# Patient Record
Sex: Female | Born: 1995 | Race: Black or African American | Hispanic: No | Marital: Single | Smoking: Never smoker
Health system: Southern US, Community
[De-identification: ages and names within clinical notes are randomized; demographics above are authoritative.]

## PROBLEM LIST (undated history)

## (undated) DIAGNOSIS — D649 Anemia, unspecified: Secondary | ICD-10-CM

## (undated) DIAGNOSIS — K219 Gastro-esophageal reflux disease without esophagitis: Secondary | ICD-10-CM

## (undated) HISTORY — DX: Gastro-esophageal reflux disease without esophagitis: K21.9

## (undated) HISTORY — PX: TONSILLECTOMY: SUR1361

## (undated) HISTORY — DX: Anemia, unspecified: D64.9

---
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
## 2018-06-12 LAB — CYTOLOGY - PAP

## 2019-05-07 LAB — OB RESULTS CONSOLE ANTIBODY SCREEN: Antibody Screen: NEGATIVE

## 2019-05-07 LAB — OB RESULTS CONSOLE RUBELLA ANTIBODY, IGM: Rubella: IMMUNE

## 2019-05-07 LAB — OB RESULTS CONSOLE HGB/HCT, BLOOD
HCT: 36 (ref 29–41)
Hemoglobin: 12

## 2019-05-07 LAB — OB RESULTS CONSOLE HIV ANTIBODY (ROUTINE TESTING): HIV: NONREACTIVE

## 2019-05-07 LAB — OB RESULTS CONSOLE HEPATITIS B SURFACE ANTIGEN: Hepatitis B Surface Ag: NEGATIVE

## 2019-05-07 LAB — OB RESULTS CONSOLE ABO/RH: RH Type: POSITIVE

## 2019-06-23 LAB — OB RESULTS CONSOLE GC/CHLAMYDIA
Chlamydia: NEGATIVE
Gonorrhea: NEGATIVE

## 2019-10-01 ENCOUNTER — Telehealth: Payer: Self-pay | Admitting: Family Medicine

## 2019-10-01 NOTE — Telephone Encounter (Signed)
Attempted to call patient to get her scheduled to start prenatal care now that we have her records. No answer, number rang several times than stated that the call could not be completed at this time.

## 2019-10-02 ENCOUNTER — Telehealth: Payer: Self-pay | Admitting: Family Medicine

## 2019-10-02 NOTE — Telephone Encounter (Signed)
Opened in error

## 2019-10-05 ENCOUNTER — Ambulatory Visit (INDEPENDENT_AMBULATORY_CARE_PROVIDER_SITE_OTHER): Payer: Managed Care, Other (non HMO) | Admitting: *Deleted

## 2019-10-05 ENCOUNTER — Other Ambulatory Visit: Payer: Self-pay

## 2019-10-05 DIAGNOSIS — K219 Gastro-esophageal reflux disease without esophagitis: Secondary | ICD-10-CM

## 2019-10-05 DIAGNOSIS — Z3490 Encounter for supervision of normal pregnancy, unspecified, unspecified trimester: Secondary | ICD-10-CM

## 2019-10-05 DIAGNOSIS — O350XX0 Maternal care for (suspected) central nervous system malformation in fetus, not applicable or unspecified: Secondary | ICD-10-CM

## 2019-10-05 DIAGNOSIS — O3503X0 Maternal care for (suspected) central nervous system malformation or damage in fetus, choroid plexus cysts, not applicable or unspecified: Secondary | ICD-10-CM

## 2019-10-05 HISTORY — DX: Maternal care for (suspected) central nervous system malformation in fetus, not applicable or unspecified: O35.0XX0

## 2019-10-05 HISTORY — DX: Maternal care for (suspected) central nervous system malformation or damage in fetus, choroid plexus cysts, not applicable or unspecified: O35.03X0

## 2019-10-05 NOTE — Patient Instructions (Signed)

## 2019-10-05 NOTE — Progress Notes (Signed)
I connected with  Heather Rush on 10/05/19 at  8:30 AM EST by telephone and verified that I am speaking with the correct person using two identifiers.   I discussed the limitations, risks, security and privacy concerns of performing an evaluation and management service by telephone and the availability of in person appointments. I also discussed with the patient that there may be a patient responsible charge related to this service. The patient expressed understanding and agreed to proceed. Explained I am completing her New OB Intake today. We discussed she is a transfer of care from [place]. We discussed Her EDD and that it is based on  sure LMP . I reviewed her allergies, meds, OB History, Medical /Surgical history, and appropriate screenings. I explained I will send her the [employer] app- app sent to her while on phone.  I asked if she can purchase a blood pressure cuff  To check her blood pressure weekly as it is not covered by her insurance. I asked her to bring the blood pressure cuff with her to her first ob appointment so we can show her how to use it. Explained  then we will have her take her blood pressure weekly and enter into the app. Explained she will have some visits in office and some virtually. She already has Community education officer. Reviewed appointment date/ time with her , our location and to wear mask, no visitors. Explained she will have exam, ob bloodwork as indicated including 2 hr gtt which we will schedule for an am appointment. I offered her genetic testing as not done previously; she declines. I reviewed her previous US with Dr.Ervin and ordered a follow up US to fu choroid plexus. I  gave her the appointment. I informed her of our [facility] services and that she can schedule appointment if she feels she needs at any time in her pregnancy.  She voices understanding.  Jesly Hartmann,RN 10/05/2019  8:35 AM

## 2019-10-08 ENCOUNTER — Encounter: Payer: Self-pay | Admitting: *Deleted

## 2019-10-08 ENCOUNTER — Ambulatory Visit (INDEPENDENT_AMBULATORY_CARE_PROVIDER_SITE_OTHER): Payer: Managed Care, Other (non HMO) | Admitting: Obstetrics and Gynecology

## 2019-10-08 ENCOUNTER — Other Ambulatory Visit: Payer: Self-pay

## 2019-10-08 VITALS — BP 106/65 | HR 87 | Wt 151.0 lb

## 2019-10-08 DIAGNOSIS — B9689 Other specified bacterial agents as the cause of diseases classified elsewhere: Secondary | ICD-10-CM

## 2019-10-08 DIAGNOSIS — Z23 Encounter for immunization: Secondary | ICD-10-CM | POA: Diagnosis not present

## 2019-10-08 DIAGNOSIS — O26893 Other specified pregnancy related conditions, third trimester: Secondary | ICD-10-CM

## 2019-10-08 DIAGNOSIS — Z8619 Personal history of other infectious and parasitic diseases: Secondary | ICD-10-CM

## 2019-10-08 DIAGNOSIS — Z3A31 31 weeks gestation of pregnancy: Secondary | ICD-10-CM

## 2019-10-08 DIAGNOSIS — B373 Candidiasis of vulva and vagina: Secondary | ICD-10-CM | POA: Diagnosis not present

## 2019-10-08 DIAGNOSIS — Z113 Encounter for screening for infections with a predominantly sexual mode of transmission: Secondary | ICD-10-CM | POA: Diagnosis not present

## 2019-10-08 DIAGNOSIS — N898 Other specified noninflammatory disorders of vagina: Secondary | ICD-10-CM | POA: Diagnosis not present

## 2019-10-08 DIAGNOSIS — N760 Acute vaginitis: Secondary | ICD-10-CM

## 2019-10-08 DIAGNOSIS — O99013 Anemia complicating pregnancy, third trimester: Secondary | ICD-10-CM

## 2019-10-08 DIAGNOSIS — Z3490 Encounter for supervision of normal pregnancy, unspecified, unspecified trimester: Secondary | ICD-10-CM

## 2019-10-08 DIAGNOSIS — O99019 Anemia complicating pregnancy, unspecified trimester: Secondary | ICD-10-CM

## 2019-10-08 NOTE — Progress Notes (Signed)
Patient reports large amounts of white milky discharge with odor

## 2019-10-08 NOTE — Patient Instructions (Signed)
AREA PEDIATRIC/FAMILY PRACTICE PHYSICIANS  [place] ([address]) . [facility] o Chambliss, MD; Eniola, MD; Hale, MD; Hensel, MD; McDiarmid, MD; McIntyer, MD; Neal, MD; Walden, MD o [address] [place], [place] [address] o [phone] o Mon-Fri 8:30-12:30, 1:30-5:00 o Providers come to see babies at [facility] o Accepting Medicaid . [facility] o Limited providers who accept newborns: Koirala, MD; Morrow, MD; Wolters, MD o [address], [place], [place] [address] o [phone] o Mon-Fri 8:00-5:30 o Babies seen by providers at [facility] o Does NOT accept Medicaid o Please call early in hospitalization for appointment (limited availability)  . [facility] o Mulberry, MD o [address] [place], [place] [address] o [phone] o Mon, Tue, Thur, Fri 8:30-5:00, Wed 10:00-7:00 (closed 1-2pm) o Babies seen by [facility] providers o Accepting Medicaid . [facility] o Rubin, MD o [address], [place], [place] [address] o [phone] o Mon-Fri 8:30-5:00, Sat 8:30-12:00 o Provider comes to see babies at [facility] o Accepting Medicaid o Must have been referred from current patients or contacted office prior to delivery . [facility] ([facility]) o Brown, MD; Chandler, MD; Ettefagh, MD; Grant, MD; Lester, MD; McCormick, MD; McQueen, MD; Prose, MD; Simha, MD; Stanley, MD; Stryffeler, NP; Tebben, NP o [address], [place], [place] [address] o [phone] o Mon, Tue, Thur, Fri 8:30-5:30, Wed 9:30-5:30, Sat 8:30-12:30 o Babies seen by [facility] providers o Accepting Medicaid o Only accepting infants of first-time parents or siblings of current patients o Hospital discharge coordinator will make follow-up appointment . Jack Amos o [address],  [place], [place]  [address] o [phone]   Fax - [phone] . [facility] o [address], [place], [place]  [address] o Phone - [phone]   Fax - [phone] . Shilpa Gosrani o [address], [place], [place]  [address] o [phone]  [place] ([address]) . [facility] o Bates, MD; [place], MD; Cooper, Cox, MD; MD; Davis, MD; Dovico, MD; Ettefaugh, MD; Little, MD; Lowe, MD; Keiffer, MD; Melvin, MD; Sumner, MD; Williams, MD o [address] [place] [address], [place], [place] [address] o [phone] o Mon-Fri 8:30-5:00 (extended evenings Mon-Thur as needed), Sat-Sun 10:00-1:00 o Providers come to see babies at [facility] o Accepting Medicaid for families of first-time babies and families with all children in the household age 3 and under. Must register with office prior to making appointment (M-F only). . [facility] o Henson, NP; Knapp, MD; Lalonde, MD; Tysinger, PA o [address] [place], [place] [address] o [phone] o Mon-Fri 8:00-5:00 o Babies seen by providers at [facility] o Does NOT accept Medicaid/Commercial Insurance Only . [facility] ([facility]  o Artis, MD; Barnes, MD; Bratton, MD; Coccaro, MD; [place] Gardner, MD; Kramer, MD; Marshall, MD; Netherton, MD; Poleto, MD; Skinner, MD o [address] [place], [place] [address] o [phone] o Mon-Fri 8:30-5:30, Sat (Oct.-Mar.) 9:00-1:00 o Babies seen by providers at [facility] o Accepting Medicaid  [place] ([address]) . [facility] o Reid, MD; Warner, MD o [address], [place],  [address] o [phone] o Mon-Fri 8:30-5:00, Sat 8:30-12:00 o Providers come to see babies at [facility] o Does NOT accept Medicaid . [facility] o Becker, PA; Hagler, MD; Scifres, PA; Sun, MD; Swayne, MD o [address],  [place], [place] [address] o [phone] o Mon-Fri 8:00-5:00 o Babies seen by providers at [facility] o Does NOT accept Medicaid o Only accepting babies of parents who are patients o Please call early in hospitalization for appointment (limited availability) . [facility] o Clark, MD; Frye, MD; Kelleher, MD; Mack, NP; Miller, MD; O'Keller, MD; Patterson, NP; Pudlo, MD; Puzio, MD; Thomas, MD; Tucker, MD; Twiselton, MD o [address], [place], [place] [address] o [phone] o Mon-Fri 8:00-5:00, Sat 9:00-12:00 o Providers come to see babies at [facility] o Does NOT accept Medicaid  [place] ([address]) . [facility] o Limited providers accepting new patients: Brake, NP; Wharton, PA o [address], [place], [place] [address] o [phone] o Mon-Fri 8:00-5:00 o Babies seen by providers at [facility] o Does NOT accept Medicaid o Only accepting babies of parents who are patients o Please call early in hospitalization for appointment (limited availability) . [facility] o Gay, MD; Quinlan, MD o [address] [place], [place] [address] o [phone] (press [phone] to schedule appointment) o Mon-Fri 8:00-5:00 o Providers come to see babies at [facility] o Does NOT accept Medicaid . [facility] o Mazer, MD o [address] [place], [place] [address] o [phone] o Mon-Fri 8:30-5:00 (lunch 12:30-1:00), extended hours by appointment only Wed 5:00-6:30 o Babies seen by [facility] providers o Accepting Medicaid . [facility] o Banks, MD; Jordan, MD; Koberlein, MD o [address], [place], [place] [address] o [phone] o Mon-Fri 8:00-5:00 o Babies seen by [facility] providers o Does NOT accept Medicaid . [facility] o Parker, MD; Hunter, MD; Wallace, DO o [address] [place], [place]  [address] o [phone] o Mon-Fri 8:00-5:00 o Babies seen by [facility] providers o Does NOT accept Medicaid . [facility] o Brandon, PA; Brecken, PA; Christy, NP; Dees, MD; DeClaire, MD; DeWeese, MD; Hansen, NP; Mills, NP; Warr, NP; Smoot, NP; Summer, MD; Vapne, MD o [address] [place], [place] [address] o [phone] o Mon-Fri 8:30-5:00, Sat 10:00-1:00 o Providers come to see babies at [facility] o Does NOT accept Medicaid o Free prenatal information session Tuesdays at 4:45pm . [facility] o Bouska, MD; Gordon, PA; Jeffery, PA; Weber, PA o [address] [place] [address] o [phone] o Mon-Fri 7:30-5:30 o Babies seen by [facility] providers . [facility] o [address], [place], [place]  [address] o [phone]   Fax - [phone]  [place] ([address]) . [facility] o [place]ese, MD o [address] [place], [place] [address] o [phone] o Mon-Thur 8:00-6:00 o Providers come to see babies at [facility] o Accepting Medicaid . [facility] o Anderson, NP; Badger, MD; Beal, PA; Spencer, PA o [address] [place], [place] [address] o [phone] o Mon-Thur 7:30-7:30, Fri 7:30-4:30 o Babies seen by [facility] providers o Accepting Medicaid . [facility] o Agbuya, MD; Klett, NP; Romgoolam, MD o [address], [place], [place] [address] o [phone] o Mon-Fri 8:30-5:00, Sat 8:30-12:00 o Providers come to see babies at [facility] o Accepting Medicaid o Must have "Meet & Greet" appointment at office prior to delivery . [facility]  [facility] ) o McCord,   MD; Wallace, MD; Wood, MD o [address], [place], [place] [address] o [phone] o Mon-Wed 8:00-6:00, Thur-Fri 8:00-5:00, Sat 9:00-12:00 o Providers come to  see babies at [facility] o Does NOT accept Medicaid o Only accepting siblings of current patients . [facility]  o [address], [place], [place]  [address] o [phone]   Fax - [phone] . [facility] o [address], [place], [place]  [address] o [phone]   Fax - [phone]  [place] ([address] & [address]) . [facility] o Cirigliano, DO; Matthews, DO o [address] [place], [place] [address] o [phone] o Mon-Fri 7:00-5:00 o Babies seen by [facility] providers o Does NOT accept Medicaid . [facility] o Briscoe, MD; Howley, PA; Moreira, PA o [address], [place], [place] [address] o [phone] o Mon-Fri 8:00-5:00 o Babies seen by [facility] providers o Accepting Medicaid . [facility] o Boyd, MD; Church, PA; Jones, NP; Osborn, PA o [address], , [place] [address] o [phone] o Mon-Fri 8:00-5:00 o Babies seen by providers at [facility] o Accepting Medicaid  [place] ([address]) . [facility] [place] o Wendling, DO o [address] [place], [place] [address] o [phone] o Mon-Fri 8:00-5:00 o Babies seen by [facility] providers o Does NOT accept Medicaid o Limited availability, please call early in hospitalization to schedule follow-up . [facility] o Calderon, PA; Cummings, MD; [place]illard, MD; Martin, PA; Olson, MD; VanDeven, PA o [address], [place], [place] [address] o [phone] o Mon-Fri 8:30-5:00, Sat 9:00-12:00 o Babies seen by providers at [facility] o Accepting Medicaid o Please register online then schedule online or call office o [email or web address] . [facility] o Hunter, NP; Kumar, MD; Martin Rogers, PA o [address], [place], [place] [address] o [phone] o Mon-Fri 8:00-5:00 o Babies seen by providers at [facility] o Accepting Medicaid . [facility] o Pal[place], MD; Kristi Fleenor, NP; West, MD o [address], [place], [place] [address] o [phone] o Mon-Fri 8:00-5:30, Sat&Sun by appointment (phones open at 8:30) o Babies seen by [facility] providers o Accepting Medicaid o Must be a first-time baby or sibling of current patient . [facility] [place]  o [address], [place], [place]  [address] o [phone]   Fax - [phone]  [place] ([address]) . [facility] o Brown, PA; Cowen, PA; Rice, MD; Helton, PA; Spry, MD o [address] [place], [place] [address] o [phone] o Mon-Thur 8:00-7:00, Fri 8:00-5:00, Sat 8:00-12:00, Sun 9:00-12:00 o Babies seen by [facility] providers o Accepting Medicaid . [facility] o Coe-Goins, MD; Marshall, MD; Pierre-Louis, MD o [address], [place], [place] [address] o [phone] o Mon-Thur 8:00-5:00 o Babies seen by providers at [facility] o Accepting Medicaid . [facility] o Bratton, MD; Coe-Goins, MD; Hayes, MD; Lewis, MD; List, MD; Lott, MD; Marshall, MD; Moran, MD; O'Neal, MD; Pierre-Louis, MD; Pitonzo, MD; Scholer, MD; Spangle, MD o [address] [place], [place]   [address] o [phone] o Mon-Fri 8:00-5:30, Sat (Oct.-Mar.) 9:00-1:00 o Babies seen by providers at [facility] o Accepting Medicaid o Must fill out new patient packet, available online at [email or web address] . [facility] ([facility] o Friddle, NP; Harris, NP; Kelly, NP; Logan, MD;  Melvin, PA; Poth, MD; Ramadoss, MD; Stanton, NP o [address], [place], [place] [address] o [phone] o Mon-Thur 8:00-5:30, Fri 8:00-5:00 o Babies seen by providers at [facility] o Accepting Medicaid  [place] ([address]) . [facility] o Dixon, PA; Bonny Doon, MD; Pickard, MD; Tapia, PA o [address], [place], [place] [address] o [phone] o Mon-Fri 8:00-5:00 o Babies seen by providers at [facility] o Accepting Medicaid   [place] ([address]) . [facility] [place] o Masneri, DO; Meyers, MD; Nelson, PA o [address], [place], [place] [address] o [phone] o Mon-Fri 8:00-5:00 o Babies seen by providers at [facility] o Does NOT accept Medicaid o Limited appointment availability, please call early in hospitalization  . [facility] [place] o Kunedd, DO; McGowen, MD o [address], [place], [place] [address] o [phone] o Mon-Fri 8:00-5:00 o Babies seen by [facility] providers o Does NOT accept Medicaid . [facility] [place] o Cameron, MD; MacDonald, MD; Michaels, PA; Nayak, MD o [address] [place] [address], [place], [place] [address] o [phone] o Mon-Fri 8:00-5:00 o After hours clinic ([address] [place], [place] [address]) [phone] Mon-Fri 5:00-8:00, Sat 12:00-6:00, Sun 10:00-4:00 o Babies seen by [facility] providers o Accepting Medicaid . [facility] [place] o [address], [place], [place]  [address] o [phone]   Fax - [phone]  [place] ([address]) . [facility] [place] [facility] o Andy, MD o [address], [place], [place] [address] o [phone] o Mon-Fri 8:00-5:00 o Babies seen by [facility] providers o Does NOT accept Medicaid . [facility] [place] ([facility] [place]) o Eksir, MD o [address], [place], [place]  [address] o [phone] o Mon-Thur 8:00-7:00, Fri 8:00-5:00, Sat 8:00-12:00 o Babies seen by providers at [facility] o Accepting Medicaid - but does not have vaccinations in office (must be received elsewhere) o Limited availability, please call early in hospitalization  [place] ([address]) . [facility]  o Charlene Flemming, MD o [address], [place] [address] o [phone]  Fax [phone]   

## 2019-10-08 NOTE — Progress Notes (Signed)
History:   Heather Rush is a 23 y.o. G3P2002 at [redacted]w[redacted]d by LMP being seen today for her first obstetrical visit.  Her obstetrical history is significant for transfer of care from [place], limited prenatal care. . Patient does intend to breast feed. Pregnancy history fully reviewed.  Patient reports Vaginal discharge .      HISTORY: OB History  Gravida Para Term Preterm AB Living  3 2 2  0 0 2  SAB TAB Ectopic Multiple Live Births  0 0 0 0 2    # Outcome Date GA Lbr Len/2nd Weight Sex Delivery Anes PTL Lv  3 Current           2 Term 01/08/06 [redacted]w[redacted]d  7 lb 15 oz (3.6 kg) F Vag-Vacuum Other  LIV     Birth Comments: no complications  1 Term 09/1922 [redacted]w[redacted]d  6 lb 1 oz (2.75 kg) F Vag-Spont None  LIV     Birth Comments:  no complications    Last pap smear was done 2019 and was normal  Past Medical History:  Diagnosis Date  . Acid reflux   . Anemia   . Chlamydia 2015  . Gonorrhea 2015   Past Surgical History:  Procedure Laterality Date  . TONSILLECTOMY     Family History  Problem Relation Age of Onset  . Bipolar disorder Mother    Social History   Tobacco Use  . Smoking status: Never Smoker  . Smokeless tobacco: Never Used  Substance Use Topics  . Alcohol use: Not Currently  . Drug use: Never   No Known Allergies Current Outpatient Medications on File Prior to Visit  Medication Sig Dispense Refill  . Prenatal Vit-Fe Fumarate-FA (PRENATAL VITAMINS PO) Take 1 tablet by mouth daily.     No current facility-administered medications on file prior to visit.     Review of Systems Pertinent items noted in HPI and remainder of comprehensive ROS otherwise negative. Physical Exam:   Vitals:   10/08/19 1502  BP: 106/65  Pulse: 87  Weight: 151 lb (68.5 kg)   Fetal Heart Rate (bpm): 125 Uterus:  Fundal Height: 30 cm  System: General: well-developed, well-nourished female in no acute distress   Skin: normal coloration and turgor, no rashes   Neurologic: oriented,  normal, negative, normal mood   Extremities: normal strength, tone, and muscle mass, ROM of all joints is normal   HEENT PERRLA, extraocular movement intact and sclera clear, anicteric   Cardiovascular: regular rate and rhythm   Respiratory:  no respiratory distress, normal breath sounds   Abdomen: soft, non-tender; bowel sounds normal; no masses,  no organomegaly      Assessment:    Pregnancy: 13/12/20 Patient Active Problem List   Diagnosis Date Noted  . History of chlamydia 10/08/2019  . History of gonorrhea 10/08/2019  . Supervision of low-risk pregnancy 10/05/2019  . Choroid plexus cyst of fetus affecting care of mother, antepartum 10/05/2019  . Acid reflux 10/05/2019     Plan:    1. Encounter for supervision of low-risk pregnancy, antepartum - Welcomed patient to practice - 2 hour GTT tomorrow - does not have a BP cuff however will get one. Virtual visit in 2 weeks.  - Tdap vaccine greater than or equal to 7yo IM  2. Vaginal discharge  - Cervicovaginal ancillary only( [place])   Initial labs drawn. Continue prenatal vitamins. Genetic Screening discussed, Declines : declined. Ultrasound discussed; fetal anatomic survey: ordered. Problem list reviewed and updated. The nature of  [place] with multiple MDs and other Advanced Practice Providers was explained to patient; also emphasized that residents, students are part of our team. Routine obstetric precautions reviewed. Return in about 2 weeks (around 10/22/2019) for virtual visit [email or web address]    Rasch, Artist Pais, [place] for [employer], [place]

## 2019-10-08 NOTE — Progress Notes (Deleted)
   PRENATAL VISIT NOTE  Subjective:  Heather Rush is a 23 y.o. G3P2002 at [redacted]w[redacted]d being seen today for ongoing prenatal care.  She is currently monitored for the following issues for this low-risk pregnancy and has Supervision of low-risk pregnancy; Choroid plexus cyst of fetus affecting care of mother, antepartum; and Acid reflux on their problem list.  Patient reports Vaginal discharge.  Contractions: Irritability. Vag. Bleeding: None.  Movement: Present. Denies leaking of fluid.   The following portions of the patient's history were reviewed and updated as appropriate: allergies, current medications, past family history, past medical history, past social history, past surgical history and problem list.   Objective:   Vitals:   10/08/19 1502  BP: 106/65  Pulse: 87  Weight: 151 lb (68.5 kg)    Fetal Status:     Movement: Present     General:  Alert, oriented and cooperative. Patient is in no acute distress.  Skin: Skin is warm and dry. No rash noted.   Cardiovascular: Normal heart rate noted  Respiratory: Normal respiratory effort, no problems with respiration noted  Abdomen: Soft, gravid, appropriate for gestational age.  Pain/Pressure: Absent     Pelvic: Cervical exam deferred        Extremities: Normal range of motion.  Edema: None  Mental Status: Normal mood and affect. Normal behavior. Normal judgment and thought content.   Assessment and Plan:  Pregnancy: G3P2002 at [redacted]w[redacted]d 1. Encounter for supervision of low-risk pregnancy, antepartum  - Tdap vaccine greater than or equal to 7yo IM  2. Vaginal discharge  - Cervicovaginal ancillary only( )  {Blank single:19197::"Term","Preterm"} labor symptoms and general obstetric precautions including but not limited to vaginal bleeding, contractions, leaking of fluid and fetal movement were reviewed in detail with the patient. Please refer to After Visit Summary for other counseling recommendations.   No follow-ups on  file.  Future Appointments  Date Time Provider [place]  10/09/2019  8:00 AM WOC-WOCA LAB WOC-WOCA WOC  10/16/2019  8:30 AM WH-MFC [place] 1 WH-MFCUS MFC-US    Noni Saupe, NP

## 2019-10-09 ENCOUNTER — Other Ambulatory Visit: Payer: Managed Care, Other (non HMO)

## 2019-10-09 DIAGNOSIS — Z3490 Encounter for supervision of normal pregnancy, unspecified, unspecified trimester: Secondary | ICD-10-CM

## 2019-10-09 LAB — CERVICOVAGINAL ANCILLARY ONLY
Bacterial Vaginitis (gardnerella): POSITIVE — AB
Candida Glabrata: NEGATIVE
Candida Vaginitis: POSITIVE — AB
Comment: NEGATIVE
Comment: NEGATIVE
Comment: NEGATIVE
Comment: NEGATIVE
Trichomonas: NEGATIVE

## 2019-10-10 LAB — CBC
Hematocrit: 30.2 % — ABNORMAL LOW (ref 34.0–46.6)
Hemoglobin: 10.1 g/dL — ABNORMAL LOW (ref 11.1–15.9)
MCH: 30.7 pg (ref 26.6–33.0)
MCHC: 33.4 g/dL (ref 31.5–35.7)
MCV: 92 fL (ref 79–97)
Platelets: 244 10*3/uL (ref 150–450)
RBC: 3.29 x10E6/uL — ABNORMAL LOW (ref 3.77–5.28)
RDW: 11.6 % — ABNORMAL LOW (ref 11.7–15.4)
WBC: 9.2 10*3/uL (ref 3.4–10.8)

## 2019-10-10 LAB — GLUCOSE TOLERANCE, 2 HOURS W/ 1HR
Glucose, 1 hour: 135 mg/dL (ref 65–179)
Glucose, 2 hour: 102 mg/dL (ref 65–152)
Glucose, Fasting: 80 mg/dL (ref 65–91)

## 2019-10-10 LAB — RPR: RPR Ser Ql: NONREACTIVE

## 2019-10-10 LAB — HIV ANTIBODY (ROUTINE TESTING W REFLEX): HIV Screen 4th Generation wRfx: NONREACTIVE

## 2019-10-12 ENCOUNTER — Other Ambulatory Visit: Payer: Self-pay | Admitting: Obstetrics and Gynecology

## 2019-10-12 DIAGNOSIS — O99019 Anemia complicating pregnancy, unspecified trimester: Secondary | ICD-10-CM | POA: Insufficient documentation

## 2019-10-12 MED ORDER — METRONIDAZOLE 500 MG PO TABS: 500.0000 mg | ORAL_TABLET | Freq: Two times a day (BID) | ORAL | 0 refills | Status: AC

## 2019-10-12 MED ORDER — TERCONAZOLE 0.4 % VA CREA: 1.0000 | TOPICAL_CREAM | Freq: Every day | VAGINAL | 0 refills | Status: DC

## 2019-10-14 ENCOUNTER — Other Ambulatory Visit: Payer: Self-pay | Admitting: Obstetrics and Gynecology

## 2019-10-14 MED ORDER — METRONIDAZOLE 0.75 % VA GEL: 1.0000 | Freq: Every day | VAGINAL | 0 refills | Status: DC

## 2019-10-16 ENCOUNTER — Other Ambulatory Visit: Payer: Self-pay

## 2019-10-16 ENCOUNTER — Other Ambulatory Visit: Payer: Self-pay | Admitting: Obstetrics and Gynecology

## 2019-10-16 ENCOUNTER — Ambulatory Visit (HOSPITAL_COMMUNITY)
Admission: RE | Admit: 2019-10-16 | Discharge: 2019-10-16 | Disposition: A | Discharge status: Home / Self Care | Payer: Managed Care, Other (non HMO) | Source: Ambulatory Visit | Attending: Obstetrics and Gynecology | Admitting: Obstetrics and Gynecology

## 2019-10-16 DIAGNOSIS — O359XX0 Maternal care for (suspected) fetal abnormality and damage, unspecified, not applicable or unspecified: Secondary | ICD-10-CM

## 2019-10-16 DIAGNOSIS — Z3490 Encounter for supervision of normal pregnancy, unspecified, unspecified trimester: Secondary | ICD-10-CM

## 2019-10-16 DIAGNOSIS — Z3A32 32 weeks gestation of pregnancy: Secondary | ICD-10-CM | POA: Diagnosis not present

## 2019-10-21 ENCOUNTER — Other Ambulatory Visit: Payer: Self-pay

## 2019-10-21 ENCOUNTER — Telehealth (INDEPENDENT_AMBULATORY_CARE_PROVIDER_SITE_OTHER): Payer: Managed Care, Other (non HMO) | Admitting: Advanced Practice Midwife

## 2019-10-21 VITALS — BP 121/79 | HR 83

## 2019-10-21 DIAGNOSIS — Z3480 Encounter for supervision of other normal pregnancy, unspecified trimester: Secondary | ICD-10-CM

## 2019-10-21 DIAGNOSIS — Z3A33 33 weeks gestation of pregnancy: Secondary | ICD-10-CM

## 2019-10-21 DIAGNOSIS — Z3483 Encounter for supervision of other normal pregnancy, third trimester: Secondary | ICD-10-CM

## 2019-10-21 NOTE — Progress Notes (Signed)
I connected with  Leilani Able on 10/21/19 at  9:15 AM EST by telephone and verified that I am speaking with the correct person using two identifiers.   I discussed the limitations, risks, security and privacy concerns of performing an evaluation and management service by telephone and the availability of in person appointments. I also discussed with the patient that there may be a patient responsible charge related to this service. The patient expressed understanding and agreed to proceed.  [place], [place] 10/21/2019  9:03 AM

## 2019-10-21 NOTE — Progress Notes (Signed)
   TELEHEALTH VIRTUAL OBSTETRICS VISIT ENCOUNTER NOTE  I connected with Heather Rush on 10/21/19 at  9:15 AM EST by telephone at home and verified that I am speaking with the correct person using two identifiers.   I discussed the limitations, risks, security and privacy concerns of performing an evaluation and management service by telephone and the availability of in person appointments. I also discussed with the patient that there may be a patient responsible charge related to this service. The patient expressed understanding and agreed to proceed.  Subjective:  Heather Rush is a 23 y.o. G3P2002 at [redacted]w[redacted]d being followed for ongoing prenatal care.  She is currently monitored for the following issues for this low-risk pregnancy and has Supervision of low-risk pregnancy; Choroid plexus cyst of fetus affecting care of mother, antepartum; Acid reflux; History of chlamydia; History of gonorrhea; and Anemia in pregnancy on their problem list.  Patient reports no complaints. Reports fetal movement. Denies any contractions, bleeding or leaking of fluid.   The following portions of the patient's history were reviewed and updated as appropriate: allergies, current medications, past family history, past medical history, past social history, past surgical history and problem list.   Objective:   General:  Alert, oriented and cooperative.   Mental Status: Normal mood and affect perceived. Normal judgment and thought content.  Rest of physical exam deferred due to type of encounter  Assessment and Plan:  Pregnancy: G3P2002 at [redacted]w[redacted]d 1. Supervision of other normal pregnancy, antepartum - routine care - Has BP cuff at home, advised to do weekly BP check and upload to babyscripts.   Preterm labor symptoms and general obstetric precautions including but not limited to vaginal bleeding, contractions, leaking of fluid and fetal movement were reviewed in detail with the patient.  I discussed the  assessment and treatment plan with the patient. The patient was provided an opportunity to ask questions and all were answered. The patient agreed with the plan and demonstrated an understanding of the instructions. The patient was advised to call back or seek an in-person office evaluation/go to MAU at [facility] for any urgent or concerning symptoms. Please refer to After Visit Summary for other counseling recommendations.   I provided 10 minutes of non-face-to-face time during this encounter.  Return in about 2 weeks (around 11/04/2019) for virtual visit, she is off on Fridays and would prefer a Friday appointment. .  No future appointments.  Marcille Buffy DNP, CNM  10/21/19  9:15 AM  [facility]

## 2019-11-06 ENCOUNTER — Telehealth (INDEPENDENT_AMBULATORY_CARE_PROVIDER_SITE_OTHER): Payer: Managed Care, Other (non HMO) | Admitting: Obstetrics and Gynecology

## 2019-11-06 ENCOUNTER — Other Ambulatory Visit: Payer: Self-pay

## 2019-11-06 ENCOUNTER — Encounter: Payer: Self-pay | Admitting: Obstetrics and Gynecology

## 2019-11-06 VITALS — BP 118/73 | HR 96

## 2019-11-06 DIAGNOSIS — Z3A35 35 weeks gestation of pregnancy: Secondary | ICD-10-CM

## 2019-11-06 DIAGNOSIS — O3503X0 Maternal care for (suspected) central nervous system malformation or damage in fetus, choroid plexus cysts, not applicable or unspecified: Secondary | ICD-10-CM

## 2019-11-06 DIAGNOSIS — O350XX0 Maternal care for (suspected) central nervous system malformation in fetus, not applicable or unspecified: Secondary | ICD-10-CM

## 2019-11-06 DIAGNOSIS — Z3493 Encounter for supervision of normal pregnancy, unspecified, third trimester: Secondary | ICD-10-CM

## 2019-11-06 NOTE — Progress Notes (Signed)
TELEHEALTH OBSTETRICS PRENATAL VIRTUAL VIDEO VISIT ENCOUNTER NOTE  Provider location: [facility] [place]   I connected with Braulio Conte on 11/06/19 at 11:15 AM EST by MyChart Video Encounter at home and verified that I am speaking with the correct person using two identifiers.   I discussed the limitations, risks, security and privacy concerns of performing an evaluation and management service virtually and the availability of in person appointments. I also discussed with the patient that there may be a patient responsible charge related to this service. The patient expressed understanding and agreed to proceed. Subjective:  Heather Rush is a 23 y.o. G3P2002 at [redacted]w[redacted]d being seen today for ongoing prenatal care.  She is currently monitored for the following issues for this low-risk pregnancy and has Supervision of low-risk pregnancy; Choroid plexus cyst of fetus affecting care of mother, antepartum; Acid reflux; History of chlamydia; History of gonorrhea; and Anemia in pregnancy on their problem list.  Patient reports general discomforts of pregnancy.  Contractions: Irritability. Vag. Bleeding: None.  Movement: Present. Denies any leaking of fluid.   The following portions of the patient's history were reviewed and updated as appropriate: allergies, current medications, past family history, past medical history, past social history, past surgical history and problem list.   Objective:   Vitals:   11/06/19 1123  BP: 118/73  Pulse: 96    Fetal Status:     Movement: Present     General:  Alert, oriented and cooperative. Patient is in no acute distress.  Respiratory: Normal respiratory effort, no problems with respiration noted  Mental Status: Normal mood and affect. Normal behavior. Normal judgment and thought content.  Rest of physical exam deferred due to type of encounter  Imaging: [place] MFM OB DETAIL +14 WK  Result Date:  10/16/2019 ----------------------------------------------------------------------  OBSTETRICS REPORT                       (Signed Final 10/16/2019 10:34 am) ---------------------------------------------------------------------- Patient Info  ID #:       [id number]                          D.O.B.:  Jun 23, 1996 (23 yrs)  Name:       Heather Rush                 Visit Date: 10/16/2019 08:35 am ---------------------------------------------------------------------- Performed By  Performed By:     Tomma Lightning             Ref. Address:     [address]                                                             [place], [place]  [id number]  Attending:        Ma RingsVictor Fang MD         [facility]  Referred By:      Hermina StaggersMICHAEL L Aizza Santiago                    MD ---------------------------------------------------------------------- Orders   #  Description                          Code         Ordered By   1  US MFM OB DETAIL +14 WK              [id number]     Nettie ElmMICHAEL Tikia Skilton  ----------------------------------------------------------------------   #  Order #                    Accession #                 Episode #   1  [id number]                  [id number]                  [id number]  ---------------------------------------------------------------------- Indications   Fetal abnormality - other known or             O35.9XX0   suspected ([facility] [place]; resolved, not seen on today's US)   Encounter for antenatal screening for          Z36.3   malformations   [redacted] weeks gestation of pregnancy                Z3A.32  ---------------------------------------------------------------------- Fetal Evaluation  Num Of Fetuses:         1  Fetal Heart Rate(bpm):  145  Cardiac Activity:        Observed  Presentation:           Cephalic  Placenta:               Posterior  P. Cord Insertion:      Visualized  Amniotic Fluid  AFI FV:      Within normal limits  AFI Sum(cm)     %Tile       Largest Pocket(cm)  11.96           32          4.89  RUQ(cm)       RLQ(cm)       LUQ(cm)        LLQ(cm)  4.89          3.04          3.4            0.63 ---------------------------------------------------------------------- Biometry  BPD:      86.1  mm     G. Age:  34w 5d         94  %    CI:  78.86   %    70 - 86                                                          FL/HC:      19.3   %    19.1 - 21.3  HC:      306.6  mm     G. Age:  34w 1d         60  %    HC/AC:      1.11        0.96 - 1.17  AC:      276.6  mm     G. Age:  31w 5d         29  %    FL/BPD:     68.6   %    71 - 87  FL:       59.1  mm     G. Age:  30w 6d          7  %    FL/AC:      21.4   %    20 - 24  HUM:      54.2  mm     G. Age:  31w 4d         36  %  LV:        2.7  mm  Est. FW:    1867  gm      4 lb 2 oz     25  % ---------------------------------------------------------------------- OB History  Gravidity:    3         Term:   2  Living:       2 ---------------------------------------------------------------------- Gestational Age  LMP:           32w 3d        Date:  03/03/19                 EDD:   12/08/19  U/S Today:     32w 6d                                        EDD:   12/05/19  Best:          Armida Sans 3d     Det. By:  LMP  (03/03/19)          EDD:   12/08/19 ---------------------------------------------------------------------- Anatomy  Cranium:               Appears normal         LVOT:                   Appears normal  Cavum:                 Not well visualized    Aortic Arch:            Appears normal  Ventricles:            Appears normal         Ductal Arch:            Not well visualized  Choroid Plexus:  Appears normal         Diaphragm:              Appears normal  Cerebellum:            Appears normal         Stomach:                 Appears normal, left                                                                        sided  Posterior Fossa:       Appears normal         Abdomen:                Appears normal  Nuchal Fold:           Not applicable (>20    Abdominal Wall:         Not well visualized                         wks GA)  Face:                  Appears normal         Cord Vessels:           Appears normal (3                         (orbits and profile)                           vessel cord)  Lips:                  Appears normal         Kidneys:                Appear normal  Palate:                Not well visualized    Bladder:                Appears normal  Thoracic:              Appears normal         Spine:                  Ltd views no                                                                        intracranial signs of  NTD  Heart:                 Appears normal         Upper Extremities:      Appears normal                         (4CH, axis, and                         situs)  RVOT:                  Not well visualized    Lower Extremities:      Appears normal  Other:  Heels visualized. Technically difficult due to advanced GA and fetal          position. ---------------------------------------------------------------------- Cervix Uterus Adnexa  Cervix  Not visualized (advanced GA >24wks)  Uterus  No abnormality visualized.  Left Ovary  Size(cm)       3.2  x   1.3    x  2.5       Vol(ml): 5.45  Within normal limits.  Right Ovary  Size(cm)       2.1  x   1.3    x  1.8       Vol(ml): 2.57  Within normal limits. ---------------------------------------------------------------------- Comments  This patient was seen for a detailed fetal anatomy scan as  she recently transferred her care for delivery at [facility]  from hospital in [place].  The patient reports 2  prior uncomplicated full-term deliveries. She denies any  significant  past medical history and denies any problems in  her current pregnancy.  Choroid plexus cysts were noted on  her fetal anatomy scan performed in [place].  She was informed that the fetal growth and amniotic fluid  level were appropriate for her gestational age.  The previously noted choroid plexus cysts were not visualized  today.  The views of the fetal anatomy were limited today due  to her advanced gestational age.  The patient was informed that anomalies may be missed due  to technical limitations. If the fetus is in a suboptimal position  or maternal habitus is increased, visualization of the fetus in  the maternal uterus may be impaired.  Follow up as indicated. ----------------------------------------------------------------------                   Ma Rings, MD Electronically Signed Final Report   10/16/2019 10:34 am ----------------------------------------------------------------------   Assessment and Plan:  Pregnancy: [id number] at [redacted]w[redacted]d 1. Encounter for supervision of low-risk pregnancy in third trimester Stable GBS next visit  2. Choroid plexus cyst of fetus affecting care of mother, antepartum, single or unspecified fetus Resolved on 09/2019 U/S  Preterm labor symptoms and general obstetric precautions including but not limited to vaginal bleeding, contractions, leaking of fluid and fetal movement were reviewed in detail with the patient. I discussed the assessment and treatment plan with the patient. The patient was provided an opportunity to ask questions and all were answered. The patient agreed with the plan and demonstrated an understanding of the instructions. The patient was advised to call back or seek an in-person office evaluation/go to MAU at [facility] for any urgent or concerning symptoms. Please refer to After Visit Summary for other counseling recommendations.   I provided 8 minutes of face-to-face time during this encounter.  Return in about 1 week  (around  11/13/2019) for OB visit, face to face any provider, prefers Friday's.  No future appointments.  Chancy Milroy, MD [facility] for [place], [place]

## 2019-11-06 NOTE — Patient Instructions (Signed)
Third Trimester of Pregnancy The third trimester is from week 28 through week 40 (months 7 through 9). The third trimester is a time when the unborn baby (fetus) is growing rapidly. At the end of the ninth month, the fetus is about 20 inches in length and weighs 6-10 pounds. Body changes during your third trimester Your body will continue to go through many changes during pregnancy. The changes vary from woman to woman. During the third trimester:  Your weight will continue to increase. You can expect to gain 25-35 pounds (11-16 kg) by the end of the pregnancy.  You may begin to get stretch marks on your hips, abdomen, and breasts.  You may urinate more often because the fetus is moving lower into your pelvis and pressing on your bladder.  You may develop or continue to have heartburn. This is caused by increased hormones that slow down muscles in the digestive tract.  You may develop or continue to have constipation because increased hormones slow digestion and cause the muscles that push waste through your intestines to relax.  You may develop hemorrhoids. These are swollen veins (varicose veins) in the rectum that can itch or be painful.  You may develop swollen, bulging veins (varicose veins) in your legs.  You may have increased body aches in the pelvis, back, or thighs. This is due to weight gain and increased hormones that are relaxing your joints.  You may have changes in your hair. These can include thickening of your hair, rapid growth, and changes in texture. Some women also have hair loss during or after pregnancy, or hair that feels dry or thin. Your hair will most likely return to normal after your baby is born.  Your breasts will continue to grow and they will continue to become tender. A yellow fluid (colostrum) may leak from your breasts. This is the first milk you are producing for your baby.  Your belly button may stick out.  You may notice more swelling in your hands,  face, or ankles.  You may have increased tingling or numbness in your hands, arms, and legs. The skin on your belly may also feel numb.  You may feel short of breath because of your expanding uterus.  You may have more problems sleeping. This can be caused by the size of your belly, increased need to urinate, and an increase in your body's metabolism.  You may notice the fetus "dropping," or moving lower in your abdomen (lightening).  You may have increased vaginal discharge.  You may notice your joints feel loose and you may have pain around your pelvic bone. What to expect at prenatal visits You will have prenatal exams every 2 weeks until week 36. Then you will have weekly prenatal exams. During a routine prenatal visit:  You will be weighed to make sure you and the baby are growing normally.  Your blood pressure will be taken.  Your abdomen will be measured to track your baby's growth.  The fetal heartbeat will be listened to.  Any test results from the previous visit will be discussed.  You may have a cervical check near your due date to see if your cervix has softened or thinned (effaced).  You will be tested for Group B streptococcus. This happens between 35 and 37 weeks. Your health care provider may ask you:  What your birth plan is.  How you are feeling.  If you are feeling the baby move.  If you have had any abnormal   symptoms, such as leaking fluid, bleeding, severe headaches, or abdominal cramping.  If you are using any tobacco products, including cigarettes, chewing tobacco, and electronic cigarettes.  If you have any questions. Other tests or screenings that may be performed during your third trimester include:  Blood tests that check for low iron levels (anemia).  Fetal testing to check the health, activity level, and growth of the fetus. Testing is done if you have certain medical conditions or if there are problems during the pregnancy.  Nonstress test  (NST). This test checks the health of your baby to make sure there are no signs of problems, such as the baby not getting enough oxygen. During this test, a belt is placed around your belly. The baby is made to move, and its heart rate is monitored during movement. What is false labor? False labor is a condition in which you feel small, irregular tightenings of the muscles in the womb (contractions) that usually go away with rest, changing position, or drinking water. These are called Braxton Hicks contractions. Contractions may last for hours, days, or even weeks before true labor sets in. If contractions come at regular intervals, become more frequent, increase in intensity, or become painful, you should see your health care provider. What are the signs of labor?  Abdominal cramps.  Regular contractions that start at 10 minutes apart and become stronger and more frequent with time.  Contractions that start on the top of the uterus and spread down to the lower abdomen and back.  Increased pelvic pressure and dull back pain.  A watery or bloody mucus discharge that comes from the vagina.  Leaking of amniotic fluid. This is also known as your "water breaking." It could be a slow trickle or a gush. Let your health care provider know if it has a color or strange odor. If you have any of these signs, call your health care provider right away, even if it is before your due date. Follow these instructions at home: Medicines  Follow your health care provider's instructions regarding medicine use. Specific medicines may be either safe or unsafe to take during pregnancy.  Take a prenatal vitamin that contains at least 600 micrograms (mcg) of folic acid.  If you develop constipation, try taking a stool softener if your health care provider approves. Eating and drinking   Eat a balanced diet that includes fresh fruits and vegetables, whole grains, good sources of protein such as meat, eggs, or tofu,  and low-fat dairy. Your health care provider will help you determine the amount of weight gain that is right for you.  Avoid raw meat and uncooked cheese. These carry germs that can cause birth defects in the baby.  If you have low calcium intake from food, talk to your health care provider about whether you should take a daily calcium supplement.  Eat four or five small meals rather than three large meals a day.  Limit foods that are high in fat and processed sugars, such as fried and sweet foods.  To prevent constipation: ? Drink enough fluid to keep your urine clear or pale yellow. ? Eat foods that are high in fiber, such as fresh fruits and vegetables, whole grains, and beans. Activity  Exercise only as directed by your health care provider. Most women can continue their usual exercise routine during pregnancy. Try to exercise for 30 minutes at least 5 days a week. Stop exercising if you experience uterine contractions.  Avoid heavy lifting.  Do   not exercise in extreme heat or humidity, or at high altitudes.  Wear low-heel, comfortable shoes.  Practice good posture.  You may continue to have sex unless your health care provider tells you otherwise. Relieving pain and discomfort  Take frequent breaks and rest with your legs elevated if you have leg cramps or low back pain.  Take warm sitz baths to soothe any pain or discomfort caused by hemorrhoids. Use hemorrhoid cream if your health care provider approves.  Wear a good support bra to prevent discomfort from breast tenderness.  If you develop varicose veins: ? Wear support pantyhose or compression stockings as told by your healthcare provider. ? Elevate your feet for 15 minutes, 3-4 times a day. Prenatal care  Write down your questions. Take them to your prenatal visits.  Keep all your prenatal visits as told by your health care provider. This is important. Safety  Wear your seat belt at all times when driving.  Make  a list of emergency phone numbers, including numbers for family, friends, the hospital, and police and fire departments. General instructions  Avoid cat litter boxes and soil used by cats. These carry germs that can cause birth defects in the baby. If you have a cat, ask someone to clean the litter box for you.  Do not travel far distances unless it is absolutely necessary and only with the approval of your health care provider.  Do not use hot tubs, steam rooms, or saunas.  Do not drink alcohol.  Do not use any products that contain nicotine or tobacco, such as cigarettes and e-cigarettes. If you need help quitting, ask your health care provider.  Do not use any medicinal herbs or unprescribed drugs. These chemicals affect the formation and growth of the baby.  Do not douche or use tampons or scented sanitary pads.  Do not cross your legs for long periods of time.  To prepare for the arrival of your baby: ? Take prenatal classes to understand, practice, and ask questions about labor and delivery. ? Make a trial run to the hospital. ? Visit the hospital and tour the maternity area. ? Arrange for maternity or paternity leave through employers. ? Arrange for family and friends to take care of pets while you are in the hospital. ? Purchase a rear-facing car seat and make sure you know how to install it in your car. ? Pack your hospital bag. ? Prepare the baby's nursery. Make sure to remove all pillows and stuffed animals from the baby's crib to prevent suffocation.  Visit your dentist if you have not gone during your pregnancy. Use a soft toothbrush to brush your teeth and be gentle when you floss. Contact a health care provider if:  You are unsure if you are in labor or if your water has broken.  You become dizzy.  You have mild pelvic cramps, pelvic pressure, or nagging pain in your abdominal area.  You have lower back pain.  You have persistent nausea, vomiting, or diarrhea.   You have an unusual or bad smelling vaginal discharge.  You have pain when you urinate. Get help right away if:  Your water breaks before 37 weeks.  You have regular contractions less than 5 minutes apart before 37 weeks.  You have a fever.  You are leaking fluid from your vagina.  You have spotting or bleeding from your vagina.  You have severe abdominal pain or cramping.  You have rapid weight loss or weight gain.  You have   shortness of breath with chest pain.  You notice sudden or extreme swelling of your face, hands, ankles, feet, or legs.  Your baby makes fewer than 10 movements in 2 hours.  You have severe headaches that do not go away when you take medicine.  You have vision changes. Summary  The third trimester is from week 28 through week 40, months 7 through 9. The third trimester is a time when the unborn baby (fetus) is growing rapidly.  During the third trimester, your discomfort may increase as you and your baby continue to gain weight. You may have abdominal, leg, and back pain, sleeping problems, and an increased need to urinate.  During the third trimester your breasts will keep growing and they will continue to become tender. A yellow fluid (colostrum) may leak from your breasts. This is the first milk you are producing for your baby.  False labor is a condition in which you feel small, irregular tightenings of the muscles in the womb (contractions) that eventually go away. These are called Braxton Hicks contractions. Contractions may last for hours, days, or even weeks before true labor sets in.  Signs of labor can include: abdominal cramps; regular contractions that start at 10 minutes apart and become stronger and more frequent with time; watery or bloody mucus discharge that comes from the vagina; increased pelvic pressure and dull back pain; and leaking of amniotic fluid. This information is not intended to replace advice given to you by your health  care provider. Make sure you discuss any questions you have with your health care provider. Document Released: 11/06/2001 Document Revised: 03/05/2019 Document Reviewed: 12/18/2016 [employer] Patient Education  2020 [employer]  

## 2019-11-06 NOTE — Progress Notes (Signed)
11:00a-Called Pt for My Chart visit, no answer. Eft VM that will call back in 10 min.

## 2019-11-13 ENCOUNTER — Ambulatory Visit (INDEPENDENT_AMBULATORY_CARE_PROVIDER_SITE_OTHER): Payer: Managed Care, Other (non HMO) | Admitting: Obstetrics and Gynecology

## 2019-11-13 ENCOUNTER — Other Ambulatory Visit: Payer: Self-pay

## 2019-11-13 ENCOUNTER — Encounter: Payer: Self-pay | Admitting: Obstetrics and Gynecology

## 2019-11-13 ENCOUNTER — Other Ambulatory Visit (HOSPITAL_COMMUNITY)
Admission: RE | Admit: 2019-11-13 | Discharge: 2019-11-13 | Disposition: A | Discharge status: Home / Self Care | Payer: Managed Care, Other (non HMO) | Source: Ambulatory Visit | Attending: Obstetrics and Gynecology | Admitting: Obstetrics and Gynecology

## 2019-11-13 VITALS — BP 123/71 | HR 79 | Wt 158.8 lb

## 2019-11-13 DIAGNOSIS — Z3A36 36 weeks gestation of pregnancy: Secondary | ICD-10-CM

## 2019-11-13 DIAGNOSIS — Z3493 Encounter for supervision of normal pregnancy, unspecified, third trimester: Secondary | ICD-10-CM | POA: Insufficient documentation

## 2019-11-13 NOTE — Progress Notes (Signed)
Subjective:  Heather Rush is a 23 y.o. G3P2002 at [redacted]w[redacted]d being seen today for ongoing prenatal care.  She is currently monitored for the following issues for this low-risk pregnancy and has Supervision of low-risk pregnancy; Acid reflux; History of chlamydia; History of gonorrhea; and Anemia in pregnancy on their problem list.  Patient reports general discomforts of pregnancy.  Contractions: Not present. Vag. Bleeding: None.  Movement: Present. Denies leaking of fluid.   The following portions of the patient's history were reviewed and updated as appropriate: allergies, current medications, past family history, past medical history, past social history, past surgical history and problem list. Problem list updated.  Objective:   Vitals:   11/13/19 0930  BP: 123/71  Pulse: 79  Weight: 158 lb 12.8 oz (72 kg)    Fetal Status: Fetal Heart Rate (bpm): 140   Movement: Present     General:  Alert, oriented and cooperative. Patient is in no acute distress.  Skin: Skin is warm and dry. No rash noted.   Cardiovascular: Normal heart rate noted  Respiratory: Normal respiratory effort, no problems with respiration noted  Abdomen: Soft, gravid, appropriate for gestational age. Pain/Pressure: Present     Pelvic:  Cervical exam performed        Extremities: Normal range of motion.  Edema: None  Mental Status: Normal mood and affect. Normal behavior. Normal judgment and thought content.   Urinalysis:      Assessment and Plan:  Pregnancy: G3P2002 at [redacted]w[redacted]d  1. Encounter for supervision of low-risk pregnancy in third trimester Labor precautions - GC/Chlamydia probe amp ()not at [facility] - Culture, beta strep (group b only)  Term labor symptoms and general obstetric precautions including but not limited to vaginal bleeding, contractions, leaking of fluid and fetal movement were reviewed in detail with the patient. Please refer to After Visit Summary for other counseling recommendations.   Return in about 2 weeks (around 11/27/2019) for OB visit, virtual, any provider.   Chancy Milroy, MD

## 2019-11-13 NOTE — Patient Instructions (Addendum)
Third Trimester of Pregnancy The third trimester is from week 28 through week 40 (months 7 through 9). The third trimester is a time when the unborn baby (fetus) is growing rapidly. At the end of the ninth month, the fetus is about 20 inches in length and weighs 6-10 pounds. Body changes during your third trimester Your body will continue to go through many changes during pregnancy. The changes vary from woman to woman. During the third trimester:  Your weight will continue to increase. You can expect to gain 25-35 pounds (11-16 kg) by the end of the pregnancy.  You may begin to get stretch marks on your hips, abdomen, and breasts.  You may urinate more often because the fetus is moving lower into your pelvis and pressing on your bladder.  You may develop or continue to have heartburn. This is caused by increased hormones that slow down muscles in the digestive tract.  You may develop or continue to have constipation because increased hormones slow digestion and cause the muscles that push waste through your intestines to relax.  You may develop hemorrhoids. These are swollen veins (varicose veins) in the rectum that can itch or be painful.  You may develop swollen, bulging veins (varicose veins) in your legs.  You may have increased body aches in the pelvis, back, or thighs. This is due to weight gain and increased hormones that are relaxing your joints.  You may have changes in your hair. These can include thickening of your hair, rapid growth, and changes in texture. Some women also have hair loss during or after pregnancy, or hair that feels dry or thin. Your hair will most likely return to normal after your baby is born.  Your breasts will continue to grow and they will continue to become tender. A yellow fluid (colostrum) may leak from your breasts. This is the first milk you are producing for your baby.  Your belly button may stick out.  You may notice more swelling in your hands,  face, or ankles.  You may have increased tingling or numbness in your hands, arms, and legs. The skin on your belly may also feel numb.  You may feel short of breath because of your expanding uterus.  You may have more problems sleeping. This can be caused by the size of your belly, increased need to urinate, and an increase in your body's metabolism.  You may notice the fetus "dropping," or moving lower in your abdomen (lightening).  You may have increased vaginal discharge.  You may notice your joints feel loose and you may have pain around your pelvic bone. What to expect at prenatal visits You will have prenatal exams every 2 weeks until week 36. Then you will have weekly prenatal exams. During a routine prenatal visit:  You will be weighed to make sure you and the baby are growing normally.  Your blood pressure will be taken.  Your abdomen will be measured to track your baby's growth.  The fetal heartbeat will be listened to.  Any test results from the previous visit will be discussed.  You may have a cervical check near your due date to see if your cervix has softened or thinned (effaced).  You will be tested for Group B streptococcus. This happens between 35 and 37 weeks. Your health care provider may ask you:  What your birth plan is.  How you are feeling.  If you are feeling the baby move.  If you have had any abnormal   symptoms, such as leaking fluid, bleeding, severe headaches, or abdominal cramping.  If you are using any tobacco products, including cigarettes, chewing tobacco, and electronic cigarettes.  If you have any questions. Other tests or screenings that may be performed during your third trimester include:  Blood tests that check for low iron levels (anemia).  Fetal testing to check the health, activity level, and growth of the fetus. Testing is done if you have certain medical conditions or if there are problems during the pregnancy.  Nonstress test  (NST). This test checks the health of your baby to make sure there are no signs of problems, such as the baby not getting enough oxygen. During this test, a belt is placed around your belly. The baby is made to move, and its heart rate is monitored during movement. What is false labor? False labor is a condition in which you feel small, irregular tightenings of the muscles in the womb (contractions) that usually go away with rest, changing position, or drinking water. These are called Braxton Hicks contractions. Contractions may last for hours, days, or even weeks before true labor sets in. If contractions come at regular intervals, become more frequent, increase in intensity, or become painful, you should see your health care provider. What are the signs of labor?  Abdominal cramps.  Regular contractions that start at 10 minutes apart and become stronger and more frequent with time.  Contractions that start on the top of the uterus and spread down to the lower abdomen and back.  Increased pelvic pressure and dull back pain.  A watery or bloody mucus discharge that comes from the vagina.  Leaking of amniotic fluid. This is also known as your "water breaking." It could be a slow trickle or a gush. Let your health care provider know if it has a color or strange odor. If you have any of these signs, call your health care provider right away, even if it is before your due date. Follow these instructions at home: Medicines  Follow your health care provider's instructions regarding medicine use. Specific medicines may be either safe or unsafe to take during pregnancy.  Take a prenatal vitamin that contains at least 600 micrograms (mcg) of folic acid.  If you develop constipation, try taking a stool softener if your health care provider approves. Eating and drinking   Eat a balanced diet that includes fresh fruits and vegetables, whole grains, good sources of protein such as meat, eggs, or tofu,  and low-fat dairy. Your health care provider will help you determine the amount of weight gain that is right for you.  Avoid raw meat and uncooked cheese. These carry germs that can cause birth defects in the baby.  If you have low calcium intake from food, talk to your health care provider about whether you should take a daily calcium supplement.  Eat four or five small meals rather than three large meals a day.  Limit foods that are high in fat and processed sugars, such as fried and sweet foods.  To prevent constipation: ? Drink enough fluid to keep your urine clear or pale yellow. ? Eat foods that are high in fiber, such as fresh fruits and vegetables, whole grains, and beans. Activity  Exercise only as directed by your health care provider. Most women can continue their usual exercise routine during pregnancy. Try to exercise for 30 minutes at least 5 days a week. Stop exercising if you experience uterine contractions.  Avoid heavy lifting.  Do   not exercise in extreme heat or humidity, or at high altitudes.  Wear low-heel, comfortable shoes.  Practice good posture.  You may continue to have sex unless your health care provider tells you otherwise. Relieving pain and discomfort  Take frequent breaks and rest with your legs elevated if you have leg cramps or low back pain.  Take warm sitz baths to soothe any pain or discomfort caused by hemorrhoids. Use hemorrhoid cream if your health care provider approves.  Wear a good support bra to prevent discomfort from breast tenderness.  If you develop varicose veins: ? Wear support pantyhose or compression stockings as told by your healthcare provider. ? Elevate your feet for 15 minutes, 3-4 times a day. Prenatal care  Write down your questions. Take them to your prenatal visits.  Keep all your prenatal visits as told by your health care provider. This is important. Safety  Wear your seat belt at all times when driving.  Make  a list of emergency phone numbers, including numbers for family, friends, the hospital, and police and fire departments. General instructions  Avoid cat litter boxes and soil used by cats. These carry germs that can cause birth defects in the baby. If you have a cat, ask someone to clean the litter box for you.  Do not travel far distances unless it is absolutely necessary and only with the approval of your health care provider.  Do not use hot tubs, steam rooms, or saunas.  Do not drink alcohol.  Do not use any products that contain nicotine or tobacco, such as cigarettes and e-cigarettes. If you need help quitting, ask your health care provider.  Do not use any medicinal herbs or unprescribed drugs. These chemicals affect the formation and growth of the baby.  Do not douche or use tampons or scented sanitary pads.  Do not cross your legs for long periods of time.  To prepare for the arrival of your baby: ? Take prenatal classes to understand, practice, and ask questions about labor and delivery. ? Make a trial run to the hospital. ? Visit the hospital and tour the maternity area. ? Arrange for maternity or paternity leave through employers. ? Arrange for family and friends to take care of pets while you are in the hospital. ? Purchase a rear-facing car seat and make sure you know how to install it in your car. ? Pack your hospital bag. ? Prepare the baby's nursery. Make sure to remove all pillows and stuffed animals from the baby's crib to prevent suffocation.  Visit your dentist if you have not gone during your pregnancy. Use a soft toothbrush to brush your teeth and be gentle when you floss. Contact a health care provider if:  You are unsure if you are in labor or if your water has broken.  You become dizzy.  You have mild pelvic cramps, pelvic pressure, or nagging pain in your abdominal area.  You have lower back pain.  You have persistent nausea, vomiting, or  diarrhea.  You have an unusual or bad smelling vaginal discharge.  You have pain when you urinate. Get help right away if:  Your water breaks before 37 weeks.  You have regular contractions less than 5 minutes apart before 37 weeks.  You have a fever.  You are leaking fluid from your vagina.  You have spotting or bleeding from your vagina.  You have severe abdominal pain or cramping.  You have rapid weight loss or weight gain.  You have   shortness of breath with chest pain.  You notice sudden or extreme swelling of your face, hands, ankles, feet, or legs.  Your baby makes fewer than 10 movements in 2 hours.  You have severe headaches that do not go away when you take medicine.  You have vision changes. Summary  The third trimester is from week 28 through week 40, months 7 through 9. The third trimester is a time when the unborn baby (fetus) is growing rapidly.  During the third trimester, your discomfort may increase as you and your baby continue to gain weight. You may have abdominal, leg, and back pain, sleeping problems, and an increased need to urinate.  During the third trimester your breasts will keep growing and they will continue to become tender. A yellow fluid (colostrum) may leak from your breasts. This is the first milk you are producing for your baby.  False labor is a condition in which you feel small, irregular tightenings of the muscles in the womb (contractions) that eventually go away. These are called Braxton Hicks contractions. Contractions may last for hours, days, or even weeks before true labor sets in.  Signs of labor can include: abdominal cramps; regular contractions that start at 10 minutes apart and become stronger and more frequent with time; watery or bloody mucus discharge that comes from the vagina; increased pelvic pressure and dull back pain; and leaking of amniotic fluid. This information is not intended to replace advice given to you by your  health care provider. Make sure you discuss any questions you have with your health care provider. Document Released: 11/06/2001 Document Revised: 03/05/2019 Document Reviewed: 12/18/2016 Elsevier Patient Education  2020 [employer]  Vaginal Delivery  Vaginal delivery means that you give birth by pushing your baby out of your birth canal (vagina). A team of health care providers will help you before, during, and after vaginal delivery. Birth experiences are unique for every woman and every pregnancy, and birth experiences vary depending on where you choose to give birth. What happens when I arrive at the birth center or hospital? Once you are in labor and have been admitted into the hospital or birth center, your health care provider may:  Review your pregnancy history and any concerns that you have.  Insert an IV into one of your veins. This may be used to give you fluids and medicines.  Check your blood pressure, pulse, temperature, and heart rate (vital signs).  Check whether your bag of water (amniotic sac) has broken (ruptured).  Talk with you about your birth plan and discuss pain control options. Monitoring Your health care provider may monitor your contractions (uterine monitoring) and your baby's heart rate (fetal monitoring). You may need to be monitored:  Often, but not continuously (intermittently).  All the time or for long periods at a time (continuously). Continuous monitoring may be needed if: ? You are taking certain medicines, such as medicine to relieve pain or make your contractions stronger. ? You have pregnancy or labor complications. Monitoring may be done by:  Placing a special stethoscope or a handheld monitoring device on your abdomen to check your baby's heartbeat and to check for contractions.  Placing monitors on your abdomen (external monitors) to record your baby's heartbeat and the frequency and length of contractions.  Placing monitors inside your  uterus through your vagina (internal monitors) to record your baby's heartbeat and the frequency, length, and strength of your contractions. Depending on the type of monitor, it may remain in  your uterus or on your baby's head until birth.  Telemetry. This is a type of continuous monitoring that can be done with external or internal monitors. Instead of having to stay in bed, you are able to move around during telemetry. Physical exam Your health care provider may perform frequent physical exams. This may include:  Checking how and where your baby is positioned in your uterus.  Checking your cervix to determine: ? Whether it is thinning out (effacing). ? Whether it is opening up (dilating). What happens during labor and delivery?  Normal labor and delivery is divided into the following three stages: Stage 1  This is the longest stage of labor.  This stage can last for hours or days.  Throughout this stage, you will feel contractions. Contractions generally feel mild, infrequent, and irregular at first. They get stronger, more frequent (about every 2-3 minutes), and more regular as you move through this stage.  This stage ends when your cervix is completely dilated to 4 inches (10 cm) and completely effaced. Stage 2  This stage starts once your cervix is completely effaced and dilated and lasts until the delivery of your baby.  This stage may last from 20 minutes to 2 hours.  This is the stage where you will feel an urge to push your baby out of your vagina.  You may feel stretching and burning pain, especially when the widest part of your baby's head passes through the vaginal opening (crowning).  Once your baby is delivered, the umbilical cord will be clamped and cut. This usually occurs after waiting a period of 1-2 minutes after delivery.  Your baby will be placed on your bare chest (skin-to-skin contact) in an upright position and covered with a warm blanket. Watch your baby  for feeding cues, like rooting or sucking, and help the baby to your breast for his or her first feeding. Stage 3  This stage starts immediately after the birth of your baby and ends after you deliver the placenta.  This stage may take anywhere from 5 to 30 minutes.  After your baby has been delivered, you will feel contractions as your body expels the placenta and your uterus contracts to control bleeding. What can I expect after labor and delivery?  After labor is over, you and your baby will be monitored closely until you are ready to go home to ensure that you are both healthy. Your health care team will teach you how to care for yourself and your baby.  You and your baby will stay in the same room (rooming in) during your hospital stay. This will encourage early bonding and successful breastfeeding.  You may continue to receive fluids and medicines through an IV.  Your uterus will be checked and massaged regularly (fundal massage).  You will have some soreness and pain in your abdomen, vagina, and the area of skin between your vaginal opening and your anus (perineum).  If an incision was made near your vagina (episiotomy) or if you had some vaginal tearing during delivery, cold compresses may be placed on your episiotomy or your tear. This helps to reduce pain and swelling.  You may be given a squirt bottle to use instead of wiping when you go to the bathroom. To use the squirt bottle, follow these steps: ? Before you urinate, fill the squirt bottle with warm water. Do not use hot water. ? After you urinate, while you are sitting on the toilet, use the squirt bottle to rinse  the area around your urethra and vaginal opening. This rinses away any urine and blood. ? Fill the squirt bottle with clean water every time you use the bathroom.  It is normal to have vaginal bleeding after delivery. Wear a sanitary pad for vaginal bleeding and discharge. Summary  Vaginal delivery means that  you will give birth by pushing your baby out of your birth canal (vagina).  Your health care provider may monitor your contractions (uterine monitoring) and your baby's heart rate (fetal monitoring).  Your health care provider may perform a physical exam.  Normal labor and delivery is divided into three stages.  After labor is over, you and your baby will be monitored closely until you are ready to go home. This information is not intended to replace advice given to you by your health care provider. Make sure you discuss any questions you have with your health care provider. Document Released: 08/21/2008 Document Revised: 12/17/2017 Document Reviewed: 12/17/2017 Elsevier Patient Education  2020 [employer].

## 2019-11-16 LAB — GC/CHLAMYDIA PROBE AMP (~~LOC~~) NOT AT ARMC
Chlamydia: NEGATIVE
Comment: NEGATIVE
Comment: NORMAL
Neisseria Gonorrhea: NEGATIVE

## 2019-11-16 LAB — CULTURE, BETA STREP (GROUP B ONLY): Strep Gp B Culture: NEGATIVE

## 2019-11-27 NOTE — L&D Delivery Note (Addendum)
[facility] Delivery Note  Heather Rush is a 24 y.o. G3P2002 s/p spontaneous vaginal at [redacted]w[redacted]d. She was admitted for Rupture of Membranes   GBS Status: Negative/-- (12/18 1214) Maximum Maternal Temperature: Temp (24hrs), Avg:99 F (37.2 C), Min:98.7 F (37.1 C), Max:99.3 F (37.4 C)   Labor Progress: . Admitted for PROM with SOL shortly after arrival  . PROM 5h 5m prior to delivery with clear fluid  . Complete dilation achieved.   Delivery Date/Time: 12/10/2019 at 0444 Delivery: Called to room and patient was complete and pushing. Head delivered LOA.  Nuchal cord present x 1, easily reduced at perineum.. Shoulder and body delivered in usual fashion. Infant with spontaneous cry, placed skin to skin on mother's abdomen, dried and stimulated. Cord clamped x 2 after 1-minute delay, and cut by FOB under my direct supervision. Cord blood drawn. Placenta delivered spontaneously with gentle cord traction. Fundus firm with massage and Pitocin. Post placental IUD placed.  Labia, perineum, vagina, and cervix inspected with 2nd degree perineal and 1st degree 2" left labial  required repair with 3.0 and 4.0 vicryl respectively..  17 mL 1% lidocaine used for analgesic prior to repairs. .   Placenta: spontaneous , intact  Complications: none EBL: 71 mL  Analgesia: none   Postpartum Planning Mom and baby to mother/baby.  . Lactation consult . Contraception ppIUD    Infant: Viable female  APGAR: 9/9  see baby's chart for weight.   Heather Rush, M.D.  12/10/2019 5:31 AM  Attestation:  I confirm that I have verified the information documented in the resident's note and that I have also personally reperformed the physical exam and all medical decision making activities.   I was gloved and present for entire delivery SVD without incident No difficulty with shoulders Lacerations as listed above Repair of same supervised by me Heather Rush, CNM

## 2019-11-30 ENCOUNTER — Encounter (HOSPITAL_COMMUNITY): Payer: Self-pay | Admitting: Family Medicine

## 2019-11-30 ENCOUNTER — Telehealth (INDEPENDENT_AMBULATORY_CARE_PROVIDER_SITE_OTHER): Payer: 59 | Admitting: Nurse Practitioner

## 2019-11-30 ENCOUNTER — Encounter: Payer: Self-pay | Admitting: Nurse Practitioner

## 2019-11-30 ENCOUNTER — Inpatient Hospital Stay (HOSPITAL_COMMUNITY)
Admission: AD | Admit: 2019-11-30 | Discharge: 2019-11-30 | Disposition: A | Discharge status: Home / Self Care | Payer: 59 | Attending: Family Medicine | Admitting: Family Medicine

## 2019-11-30 ENCOUNTER — Other Ambulatory Visit: Payer: Self-pay

## 2019-11-30 VITALS — BP 139/83

## 2019-11-30 DIAGNOSIS — Z3A38 38 weeks gestation of pregnancy: Secondary | ICD-10-CM

## 2019-11-30 DIAGNOSIS — Z8619 Personal history of other infectious and parasitic diseases: Secondary | ICD-10-CM

## 2019-11-30 DIAGNOSIS — K219 Gastro-esophageal reflux disease without esophagitis: Secondary | ICD-10-CM

## 2019-11-30 DIAGNOSIS — Z3493 Encounter for supervision of normal pregnancy, unspecified, third trimester: Secondary | ICD-10-CM | POA: Diagnosis not present

## 2019-11-30 DIAGNOSIS — O99013 Anemia complicating pregnancy, third trimester: Secondary | ICD-10-CM

## 2019-11-30 DIAGNOSIS — O471 False labor at or after 37 completed weeks of gestation: Secondary | ICD-10-CM | POA: Diagnosis not present

## 2019-11-30 DIAGNOSIS — O479 False labor, unspecified: Secondary | ICD-10-CM

## 2019-11-30 NOTE — MAU Note (Signed)
Pt reporting contractions 5-6 mins apart since 4 am. Denies LOF or VB. +FM>

## 2019-11-30 NOTE — Discharge Instructions (Signed)
Braxton Hicks Contractions °Contractions of the uterus can occur throughout pregnancy, but they are not always a sign that you are in labor. You may have practice contractions called Braxton Hicks contractions. These false labor contractions are sometimes confused with true labor. °What are Braxton Hicks contractions? °Braxton Hicks contractions are tightening movements that occur in the muscles of the uterus before labor. Unlike true labor contractions, these contractions do not result in opening (dilation) and thinning of the cervix. Toward the end of pregnancy (32-34 weeks), Braxton Hicks contractions can happen more often and may become stronger. These contractions are sometimes difficult to tell apart from true labor because they can be very uncomfortable. You should not feel embarrassed if you go to the hospital with false labor. °Sometimes, the only way to tell if you are in true labor is for your health care provider to look for changes in the cervix. The health care provider will do a physical exam and may monitor your contractions. If you are not in true labor, the exam should show that your cervix is not dilating and your water has not broken. °If there are no other health problems associated with your pregnancy, it is completely safe for you to be sent home with false labor. You may continue to have Braxton Hicks contractions until you go into true labor. °How to tell the difference between true labor and false labor °True labor °· Contractions last 30-70 seconds. °· Contractions become very regular. °· Discomfort is usually felt in the top of the uterus, and it spreads to the lower abdomen and low back. °· Contractions do not go away with walking. °· Contractions usually become more intense and increase in frequency. °· The cervix dilates and gets thinner. °False labor °· Contractions are usually shorter and not as strong as true labor contractions. °· Contractions are usually irregular. °· Contractions  are often felt in the front of the lower abdomen and in the groin. °· Contractions may go away when you walk around or change positions while lying down. °· Contractions get weaker and are shorter-lasting as time goes on. °· The cervix usually does not dilate or become thin. °Follow these instructions at home: ° °· Take over-the-counter and prescription medicines only as told by your health care provider. °· Keep up with your usual exercises and follow other instructions from your health care provider. °· Eat and drink lightly if you think you are going into labor. °· If Braxton Hicks contractions are making you uncomfortable: °? Change your position from lying down or resting to walking, or change from walking to resting. °? Sit and rest in a tub of warm water. °? Drink enough fluid to keep your urine pale yellow. Dehydration may cause these contractions. °? Do slow and deep breathing several times an hour. °· Keep all follow-up prenatal visits as told by your health care provider. This is important. °Contact a health care provider if: °· You have a fever. °· You have continuous pain in your abdomen. °Get help right away if: °· Your contractions become stronger, more regular, and closer together. °· You have fluid leaking or gushing from your vagina. °· You pass blood-tinged mucus (bloody show). °· You have bleeding from your vagina. °· You have low back pain that you never had before. °· You feel your baby’s head pushing down and causing pelvic pressure. °· Your baby is not moving inside you as much as it used to. °Summary °· Contractions that occur before labor are   called Braxton Hicks contractions, false labor, or practice contractions. °· Braxton Hicks contractions are usually shorter, weaker, farther apart, and less regular than true labor contractions. True labor contractions usually become progressively stronger and regular, and they become more frequent. °· Manage discomfort from Braxton Hicks contractions  by changing position, resting in a warm bath, drinking plenty of water, or practicing deep breathing. °This information is not intended to replace advice given to you by your health care provider. Make sure you discuss any questions you have with your health care provider. °Document Revised: 10/25/2017 Document Reviewed: 03/28/2017 °Elsevier Patient Education © 2020 [employer] Inc[employer] ° °

## 2019-11-30 NOTE — Progress Notes (Signed)
  S: Ms. Heather Rush is a 24 y.o. G3P2002 at [redacted]w[redacted]d  who presents to [facility] today complaining contractions q 6 minutes since 0400. She denies vaginal bleeding. She denies LOF. She reports normal fetal movement.    O: BP 123/72   Pulse 93   Temp 98.7 F (37.1 C) (Oral)   Resp 16   LMP 03/03/2019   SpO2 98%   Cervical exam:  Dilation: 2 Effacement (%): 80 Cervical Position: Posterior Station: -3 Presentation: Vertex Exam by:: Jolaine Artist, RN    Fetal Monitoring: Baseline: 145 Variability: moderate Accelerations: pos Decelerations: neg Contractions: infrequent; q6-58m   A: SIUP at [redacted]w[redacted]d  False labor  P: Minimal cervical change > 1 hour. GBS neg. DC home with return precautions.   Joselyn Arrow, MD 11/30/2019 12:29 PM

## 2019-11-30 NOTE — Progress Notes (Signed)
I connected with@ on 11/30/19 at  4:15 PM EST by: MyChart and verified that I am speaking with the correct person using two identifiers.  Patient is located at home and provider is located at [place] office.     The purpose of this virtual visit is to provide medical care while limiting exposure to the novel coronavirus. I discussed the limitations, risks, security and privacy concerns of performing an evaluation and management service by Nolene Bernheim, NP and the availability of in person appointments. I also discussed with the patient that there may be a patient responsible charge related to this service. By engaging in this virtual visit, you consent to the provision of healthcare.  Additionally, you authorize for your insurance to be billed for the services provided during this visit.  The patient expressed understanding and agreed to proceed.  The following staff members participated in the virtual visit:  Nolene Bernheim, NP and Marko Stai, RN    PRENATAL VISIT NOTE  Subjective:  Heather Rush is a 24 y.o. G3P2002 at [redacted]w[redacted]d  for phone visit for ongoing prenatal care.  She is currently monitored for the following issues for this low-risk pregnancy and has Supervision of low-risk pregnancy; Acid reflux; History of chlamydia; History of gonorrhea; and Anemia in pregnancy on their problem list. Seen at [facility] today and note reviewed. Patient reports contractions and indigestion.  Contractions: Irregular. Vag. Bleeding: None.  Movement: Present. Denies leaking of fluid.   The following portions of the patient's history were reviewed and updated as appropriate: allergies, current medications, past family history, past medical history, past social history, past surgical history and problem list.   Objective:   Vitals:   11/30/19 1621  BP: 139/83   Self-Obtained  Fetal Status:     Movement: Present     Assessment and Plan:  Pregnancy: G3P2002 at [redacted]w[redacted]d 1. Encounter for supervision of  low-risk pregnancy in third trimester Seen at [facility] today.  Having contractions but cervix did not change and was sent home.  Continues to have contractions at 10 min apart.  Discussed that she may be in prodromal, early labor and perhaps contractions will increase to active labor tonight or tomorrow. BP was normal in MAU today.  Review technique to take BP - not in a contraction and do not talk while taking BP.  Reviewed warning signs - baby not moving, increase in edema, HA not relieved by Tylenol, blurred vision and RUQ pain.  Advised to .heck BP tomorrow.  Advised 140/90 or greater is not normal and would need to be seen in [facility]U. Reviewed positioning for back pain with contractions. Advised in person visit if still pregnant in one week and can schedule for induction at 41 weeks.  2. Gastroesophageal reflux disease, unspecified whether esophagitis present Instructed to take Tums for now.  Term labor symptoms and general obstetric precautions including but not limited to vaginal bleeding, contractions, leaking of fluid and fetal movement were reviewed in detail with the patient.  Return in about 1 week (around 12/07/2019) for in person ROB.  Future Appointments  Date Time Provider Department Center  12/07/2019  4:15 PM Judeth Horn, NP [facility]     Time spent on virtual visit: 6 minutes  Currie Paris, NP

## 2019-11-30 NOTE — Progress Notes (Signed)
I connected with  Heather Rush on 11/30/19 at  4:15 PM EST by telephone and verified that I am speaking with the correct person using two identifiers.   I discussed the limitations, risks, security and privacy concerns of performing an evaluation and management service by telephone and the availability of in person appointments. I also discussed with the patient that there may be a patient responsible charge related to this service. The patient expressed understanding and agreed to proceed.  Requested pt log into MyChart to continue visit.  Marjo Bicker, RN 11/30/2019  4:17 PM

## 2019-12-07 ENCOUNTER — Ambulatory Visit (INDEPENDENT_AMBULATORY_CARE_PROVIDER_SITE_OTHER): Payer: 59 | Admitting: Student

## 2019-12-07 ENCOUNTER — Other Ambulatory Visit: Payer: Self-pay

## 2019-12-07 VITALS — BP 135/77 | HR 102 | Wt 164.0 lb

## 2019-12-07 DIAGNOSIS — Z3493 Encounter for supervision of normal pregnancy, unspecified, third trimester: Secondary | ICD-10-CM

## 2019-12-07 DIAGNOSIS — Z3A39 39 weeks gestation of pregnancy: Secondary | ICD-10-CM

## 2019-12-07 NOTE — Patient Instructions (Signed)

## 2019-12-07 NOTE — Progress Notes (Signed)
   PRENATAL VISIT NOTE  Subjective:  Heather Rush is a 24 y.o. G3P2002 at [redacted]w[redacted]d being seen today for ongoing prenatal care.  She is currently monitored for the following issues for this low-risk pregnancy and has Supervision of low-risk pregnancy; Acid reflux; History of chlamydia; History of gonorrhea; and Anemia in pregnancy on their problem list.  Patient reports occasional contractions.  Contractions: Irritability. Vag. Bleeding: None.  Movement: Present. Denies leaking of fluid.   The following portions of the patient's history were reviewed and updated as appropriate: allergies, current medications, past family history, past medical history, past social history, past surgical history and problem list.   Objective:   Vitals:   12/07/19 1607  BP: 135/77  Pulse: (!) 102  Weight: 164 lb (74.4 kg)    Fetal Status: Fetal Heart Rate (bpm): 148 Fundal Height: 39 cm Movement: Present  Presentation: Vertex  General:  Alert, oriented and cooperative. Patient is in no acute distress.  Skin: Skin is warm and dry. No rash noted.   Cardiovascular: Normal heart rate noted  Respiratory: Normal respiratory effort, no problems with respiration noted  Abdomen: Soft, gravid, appropriate for gestational age.  Pain/Pressure: Present     Pelvic: Cervical exam performed Dilation: 2 Effacement (%): 60 Station: -3  Extremities: Normal range of motion.  Edema: Trace  Mental Status: Normal mood and affect. Normal behavior. Normal judgment and thought content.   Assessment and Plan:  Pregnancy: G3P2002 at [redacted]w[redacted]d 1. Encounter for supervision of low-risk pregnancy in third trimester -cervical exam & membrane sweep today -scheduled for post dates induction -will bring back later this week for BPP/NST   Term labor symptoms and general obstetric precautions including but not limited to vaginal bleeding, contractions, leaking of fluid and fetal movement were reviewed in detail with the patient. Please  refer to After Visit Summary for other counseling recommendations.   Return in 3 days (on 12/10/2019) for ob visit, BPP/NST.  Future Appointments  Date Time Provider Department Center  12/15/2019  7:00 AM [facility] SCHED ROOM [facility] None    Judeth Horn, NP

## 2019-12-08 ENCOUNTER — Telehealth (HOSPITAL_COMMUNITY): Payer: Self-pay | Admitting: *Deleted

## 2019-12-08 ENCOUNTER — Encounter (HOSPITAL_COMMUNITY): Payer: Self-pay | Admitting: *Deleted

## 2019-12-08 NOTE — Telephone Encounter (Signed)
Preadmission screen  

## 2019-12-09 ENCOUNTER — Other Ambulatory Visit: Payer: Self-pay | Admitting: Advanced Practice Midwife

## 2019-12-10 ENCOUNTER — Encounter (HOSPITAL_COMMUNITY): Payer: Self-pay | Admitting: Obstetrics & Gynecology

## 2019-12-10 ENCOUNTER — Inpatient Hospital Stay (HOSPITAL_COMMUNITY)
Admission: AD | Admit: 2019-12-10 | Discharge: 2019-12-11 | DRG: 807 | Disposition: A | Discharge status: Home / Self Care | Payer: 59 | Attending: Obstetrics & Gynecology | Admitting: Obstetrics & Gynecology

## 2019-12-10 ENCOUNTER — Encounter: Payer: 59 | Admitting: Obstetrics & Gynecology

## 2019-12-10 ENCOUNTER — Other Ambulatory Visit: Payer: 59

## 2019-12-10 ENCOUNTER — Other Ambulatory Visit: Payer: Self-pay

## 2019-12-10 DIAGNOSIS — O4292 Full-term premature rupture of membranes, unspecified as to length of time between rupture and onset of labor: Secondary | ICD-10-CM | POA: Diagnosis present

## 2019-12-10 DIAGNOSIS — Z20822 Contact with and (suspected) exposure to covid-19: Secondary | ICD-10-CM | POA: Diagnosis present

## 2019-12-10 DIAGNOSIS — O26893 Other specified pregnancy related conditions, third trimester: Secondary | ICD-10-CM | POA: Diagnosis present

## 2019-12-10 DIAGNOSIS — O480 Post-term pregnancy: Secondary | ICD-10-CM

## 2019-12-10 DIAGNOSIS — Z30430 Encounter for insertion of intrauterine contraceptive device: Secondary | ICD-10-CM | POA: Diagnosis not present

## 2019-12-10 DIAGNOSIS — Z3A40 40 weeks gestation of pregnancy: Secondary | ICD-10-CM

## 2019-12-10 DIAGNOSIS — O358XX0 Maternal care for other (suspected) fetal abnormality and damage, not applicable or unspecified: Secondary | ICD-10-CM | POA: Diagnosis present

## 2019-12-10 LAB — CBC
HCT: 32 % — ABNORMAL LOW (ref 36.0–46.0)
Hemoglobin: 10.1 g/dL — ABNORMAL LOW (ref 12.0–15.0)
MCH: 30 pg (ref 26.0–34.0)
MCHC: 31.6 g/dL (ref 30.0–36.0)
MCV: 95 fL (ref 80.0–100.0)
Platelets: 364 10*3/uL (ref 150–400)
RBC: 3.37 MIL/uL — ABNORMAL LOW (ref 3.87–5.11)
RDW: 13.9 % (ref 11.5–15.5)
WBC: 13.5 10*3/uL — ABNORMAL HIGH (ref 4.0–10.5)
nRBC: 0 % (ref 0.0–0.2)

## 2019-12-10 LAB — RESPIRATORY PANEL BY RT PCR (FLU A&B, COVID)
Influenza A by PCR: NEGATIVE
Influenza B by PCR: NEGATIVE
SARS Coronavirus 2 by RT PCR: NEGATIVE

## 2019-12-10 LAB — TYPE AND SCREEN
ABO/RH(D): O POS
Antibody Screen: NEGATIVE

## 2019-12-10 LAB — RPR: RPR Ser Ql: NONREACTIVE

## 2019-12-10 LAB — ABO/RH: ABO/RH(D): O POS

## 2019-12-10 LAB — POCT FERN TEST: POCT Fern Test: POSITIVE — AB

## 2019-12-10 MED ORDER — ACETAMINOPHEN 325 MG PO TABS: 650.0000 mg | ORAL_TABLET | ORAL | Status: DC | PRN

## 2019-12-10 MED ORDER — ONDANSETRON HCL 4 MG PO TABS: 4.0000 mg | ORAL_TABLET | ORAL | Status: DC | PRN

## 2019-12-10 MED ORDER — BENZOCAINE-MENTHOL 20-0.5 % EX AERO
1.0000 "application " | INHALATION_SPRAY | CUTANEOUS | Status: DC | PRN
  Administered 2019-12-10: 1 via TOPICAL
  Filled 2019-12-10 (×2): qty 56

## 2019-12-10 MED ORDER — WITCH HAZEL-GLYCERIN EX PADS: 1.0000 "application " | MEDICATED_PAD | CUTANEOUS | Status: DC | PRN

## 2019-12-10 MED ORDER — ZOLPIDEM TARTRATE 5 MG PO TABS: 5.0000 mg | ORAL_TABLET | Freq: Every evening | ORAL | Status: DC | PRN

## 2019-12-10 MED ORDER — FENTANYL CITRATE (PF) 100 MCG/2ML IJ SOLN
INTRAMUSCULAR | Status: AC
  Filled 2019-12-10: qty 2

## 2019-12-10 MED ORDER — TETANUS-DIPHTH-ACELL PERTUSSIS 5-2.5-18.5 LF-MCG/0.5 IM SUSP: 0.5000 mL | Freq: Once | INTRAMUSCULAR | Status: DC

## 2019-12-10 MED ORDER — LACTATED RINGERS IV SOLN: 500.0000 mL | INTRAVENOUS | Status: DC | PRN

## 2019-12-10 MED ORDER — LEVONORGESTREL 19.5 MCG/DAY IU IUD
INTRAUTERINE_SYSTEM | INTRAUTERINE | Status: AC
  Administered 2019-12-10: 1 via INTRAUTERINE
  Filled 2019-12-10: qty 1

## 2019-12-10 MED ORDER — OXYTOCIN 40 UNITS IN NORMAL SALINE INFUSION - SIMPLE MED
2.5000 [IU]/h | INTRAVENOUS | Status: DC
  Filled 2019-12-10: qty 1000

## 2019-12-10 MED ORDER — OXYTOCIN BOLUS FROM INFUSION
500.0000 mL | Freq: Once | INTRAVENOUS | Status: AC
  Administered 2019-12-10: 500 mL via INTRAVENOUS

## 2019-12-10 MED ORDER — IBUPROFEN 600 MG PO TABS
600.0000 mg | ORAL_TABLET | Freq: Four times a day (QID) | ORAL | Status: DC
  Administered 2019-12-10 – 2019-12-11 (×4): 600 mg via ORAL
  Filled 2019-12-10 (×4): qty 1

## 2019-12-10 MED ORDER — COCONUT OIL OIL: 1.0000 "application " | TOPICAL_OIL | Status: DC | PRN

## 2019-12-10 MED ORDER — SODIUM CHLORIDE 0.9% FLUSH: 3.0000 mL | Freq: Two times a day (BID) | INTRAVENOUS | Status: DC

## 2019-12-10 MED ORDER — PRENATAL MULTIVITAMIN CH
1.0000 | ORAL_TABLET | Freq: Every day | ORAL | Status: DC
  Administered 2019-12-10: 1 via ORAL
  Filled 2019-12-10: qty 1

## 2019-12-10 MED ORDER — OXYCODONE HCL 5 MG PO TABS: 5.0000 mg | ORAL_TABLET | ORAL | Status: DC | PRN

## 2019-12-10 MED ORDER — LIDOCAINE HCL (PF) 1 % IJ SOLN
30.0000 mL | INTRAMUSCULAR | Status: AC | PRN
  Administered 2019-12-10: 30 mL via SUBCUTANEOUS
  Filled 2019-12-10: qty 30

## 2019-12-10 MED ORDER — OXYCODONE-ACETAMINOPHEN 5-325 MG PO TABS: 1.0000 | ORAL_TABLET | ORAL | Status: DC | PRN

## 2019-12-10 MED ORDER — OXYCODONE-ACETAMINOPHEN 5-325 MG PO TABS
2.0000 | ORAL_TABLET | ORAL | Status: DC | PRN
  Administered 2019-12-10: 2 via ORAL
  Filled 2019-12-10: qty 2

## 2019-12-10 MED ORDER — OXYCODONE HCL 5 MG PO TABS: 10.0000 mg | ORAL_TABLET | ORAL | Status: DC | PRN

## 2019-12-10 MED ORDER — MEASLES, MUMPS & RUBELLA VAC IJ SOLR: 0.5000 mL | Freq: Once | INTRAMUSCULAR | Status: DC

## 2019-12-10 MED ORDER — LACTATED RINGERS IV SOLN: INTRAVENOUS | Status: DC

## 2019-12-10 MED ORDER — LEVONORGESTREL 19.5 MCG/DAY IU IUD: INTRAUTERINE_SYSTEM | Freq: Once | INTRAUTERINE | Status: AC

## 2019-12-10 MED ORDER — DIBUCAINE (PERIANAL) 1 % EX OINT: 1.0000 "application " | TOPICAL_OINTMENT | CUTANEOUS | Status: DC | PRN

## 2019-12-10 MED ORDER — FLEET ENEMA 7-19 GM/118ML RE ENEM: 1.0000 | ENEMA | RECTAL | Status: DC | PRN

## 2019-12-10 MED ORDER — SODIUM CHLORIDE 0.9 % IV SOLN: 250.0000 mL | INTRAVENOUS | Status: DC | PRN

## 2019-12-10 MED ORDER — SIMETHICONE 80 MG PO CHEW: 80.0000 mg | CHEWABLE_TABLET | ORAL | Status: DC | PRN

## 2019-12-10 MED ORDER — ONDANSETRON HCL 4 MG/2ML IJ SOLN: 4.0000 mg | Freq: Four times a day (QID) | INTRAMUSCULAR | Status: DC | PRN

## 2019-12-10 MED ORDER — SODIUM CHLORIDE 0.9% FLUSH: 3.0000 mL | INTRAVENOUS | Status: DC | PRN

## 2019-12-10 MED ORDER — FENTANYL CITRATE (PF) 100 MCG/2ML IJ SOLN
100.0000 ug | INTRAMUSCULAR | Status: DC | PRN
  Administered 2019-12-10: 100 ug via INTRAVENOUS

## 2019-12-10 MED ORDER — SOD CITRATE-CITRIC ACID 500-334 MG/5ML PO SOLN: 30.0000 mL | ORAL | Status: DC | PRN

## 2019-12-10 MED ORDER — SENNOSIDES-DOCUSATE SODIUM 8.6-50 MG PO TABS
1.0000 | ORAL_TABLET | Freq: Two times a day (BID) | ORAL | Status: DC
  Administered 2019-12-10 – 2019-12-11 (×3): 1 via ORAL
  Filled 2019-12-10 (×3): qty 1

## 2019-12-10 MED ORDER — ONDANSETRON HCL 4 MG/2ML IJ SOLN: 4.0000 mg | INTRAMUSCULAR | Status: DC | PRN

## 2019-12-10 MED ORDER — DIPHENHYDRAMINE HCL 25 MG PO CAPS: 25.0000 mg | ORAL_CAPSULE | Freq: Four times a day (QID) | ORAL | Status: DC | PRN

## 2019-12-10 NOTE — MAU Note (Signed)
Reports SROM at 2340-clear fluid.  Irregular contractions so far.   Denies VB.

## 2019-12-10 NOTE — Lactation Note (Addendum)
This note was copied from a baby's chart. Lactation Consultation Note  Patient Name: Heather Rush [facility]'N Date: 12/10/2019  Mom is a G3.  Not very experienced with breastfeeding.  Mom breastfeeding on arrival. Mom asked about posittioning and latch. Good Breastfeeding observed.  Reports breastfed her first and second babies both about 2-3 weeks. Mom reports did formula from beginning with her second and felt that she did not have enough milk. Parents with many questions. Answered all parents questions and concerns.  Infant came off breast while there.  Nipples round and elongated.  Mom reports comfort. Showed hand expression and spoon feeding. Reviewed and left handouts.  Urged to call lactation as needed.        Maternal Data    Feeding Feeding Type: Breast Fed  LATCH Score Latch: Grasps breast easily, tongue down, lips flanged, rhythmical sucking.  Audible Swallowing: A few with stimulation  Type of Nipple: Everted at rest and after stimulation  Comfort (Breast/Nipple): Soft / non-tender  Hold (Positioning): No assistance needed to correctly position infant at breast.  [facility] Score: 9  Interventions    Lactation Tools Discussed/Used     Consult Status      [name] 12/10/2019, 7:10 PM

## 2019-12-10 NOTE — H&P (Addendum)
OBSTETRIC ADMISSION HISTORY AND PHYSICAL  Heather Rush is a 24 y.o. female G3P2002 with IUP at [redacted]w[redacted]d by LMP who presents with SROM. She reports +FMs. No LOF, VB, blurry vision, headaches, peripheral edema, or RUQ pain. She plans on breastfeeding. She requests ppIUD for birth control.  Dating: By LMP --->  Estimated Date of Delivery: 12/08/19  Sono:   06/23/19 16+0 = LMP  07/23/19 20+2  EFW 341 g. Isolated choroid plexus cyst. Normal anatomy.  32+3 =LMP, normal anatomy, Cephalic presentation, posterior placenta, 1867 g, 25th percentile  Prenatal History/Complications: Patient moved from [place] and transferred care to [facility] at 31 weeks.  Prenatal care at [facility] visit on 05/06/19 at 9+1.  Anemia  Hx of Vacuum delivery at 39+5 3.6kg.  Choroid Plexus cyst, resolved on 09/2019 [place].   Past Medical History: Past Medical History:  Diagnosis Date   Acid reflux    Anemia    Chlamydia 2015   Choroid plexus cyst of fetus affecting care of mother, antepartum 10/05/2019   Per US done in [place] 07/23/19 Follow up ordered for 10/16/19   Gonorrhea 2015    Past Surgical History: Past Surgical History:  Procedure Laterality Date   TONSILLECTOMY     # 1 - Date: 09/1922, Sex: Female, Weight: 2750 g, GA: [redacted]w[redacted]d, Delivery: Vaginal, Spontaneous, Apgar1: None, Apgar5: None, Living: Living, Birth Comments:  no complications  # 2 - Date: 01/08/06, Sex: Female, Weight: 3600 g, GA: [redacted]w[redacted]d, Delivery: Vaginal, Vacuum (Extractor), Apgar1: None, Apgar5: None, Living: Living, Birth Comments: no complications  # 3 - Date: None, Sex: None, Weight: None, GA: None, Delivery: None, Apgar1: None, Apgar5: None, Living: None, Birth Comments: None  Social History: Social History   Socioeconomic History   Marital status: Single    Spouse name: Not on file   Number of children: Not on file   Years of education: Not on file   Highest education level: Not on file   Occupational History   Not on file  Tobacco Use   Smoking status: Never Smoker   Smokeless tobacco: Never Used  Substance and Sexual Activity   Alcohol use: Not Currently   Drug use: Never   Sexual activity: Yes    Birth control/protection: None  Other Topics Concern   Not on file  Social History Narrative   Not on file   Social Determinants of Health   Financial Resource Strain:    Difficulty of Paying Living Expenses: Not on file  Food Insecurity: No Food Insecurity   Worried About Running Out of Food in the Last Year: Never true   Ran Out of Food in the Last Year: Never true  Transportation Needs: No Transportation Needs   Lack of Transportation (Medical): No   Lack of Transportation (Non-Medical): No  Physical Activity:    Days of Exercise per Week: Not on file   Minutes of Exercise per Session: Not on file  Stress:    Feeling of Stress : Not on file  Social Connections:    Frequency of Communication with Friends and Family: Not on file   Frequency of Social Gatherings with Friends and Family: Not on file   Attends Religious Services: Not on file   Active Member of Clubs or Organizations: Not on file   Attends Archivist Meetings: Not on file   Marital Status: Not on file    Family History: Family History  Problem Relation Age of Onset   Bipolar disorder  Mother    Diabetes Father     Allergies: No Known Allergies  Medications Prior to Admission  Medication Sig Dispense Refill Last Dose   Prenatal Vit-Fe Fumarate-FA (PRENATAL VITAMINS PO) Take 1 tablet by mouth daily.   12/09/2019 at Unknown time     Review of Systems: All systems reviewed and negative except as stated in HPI  PE: Blood pressure 114/67, pulse 86, temperature 98.7 F (37.1 C), temperature source Oral, resp. rate 16, height 5\' 2"  (1.575 m), weight 75.9 kg, last menstrual period 03/03/2019. General appearance: alert, cooperative and no distress Lungs: regular rate and  effort Heart: regular rate  Abdomen: soft, non-tender Extremities: Homans sign is negative, no sign of DVT Presentation: cephalic EFM: 145 bpm, mod variability, + accels, - decels Toco: q2-3 min Dilation: 2.5 Effacement (%): 80 Station: -1 Exam by:: [email or web address], RN  Prenatal labs: ABO, Rh: --/--/O POS, O POS Performed at [facility], [address] [place], [place]  [phone]) Antibody: NEG (01/14 0115) Rubella: Immune (06/11 0000) RPR: Non Reactive (11/13 0825)  HBsAg: Negative (06/11 0000)  HIV: Non Reactive (11/13 0825)  GBS: Negative/-- (12/18 1214)  3 hour GTT 80/135/102  Prenatal Transfer Tool  Maternal Diabetes: No Genetic Screening: Declined Maternal Ultrasounds/Referrals: Isolated choroid plexus cyst Fetal Ultrasounds or other Referrals:  None Maternal Substance Abuse:  No Significant Maternal Medications:  None Significant Maternal Lab Results: Group B Strep negative  Results for orders placed or performed during the hospital encounter of 12/10/19 (from the past 24 hour(s))  Respiratory Panel by RT PCR (Flu A&B, Covid) - Nasopharyngeal Swab   Collection Time: 12/10/19 12:47 AM   Specimen: Nasopharyngeal Swab  Result Value Ref Range   SARS Coronavirus 2 by RT PCR NEGATIVE NEGATIVE   Influenza A by PCR NEGATIVE NEGATIVE   Influenza B by PCR NEGATIVE NEGATIVE  Type and screen [facility]   Collection Time: 12/10/19  1:15 AM  Result Value Ref Range   ABO/RH(D) O POS    Antibody Screen NEG    Sample Expiration      12/13/2019,2359 Performed at [facility], [address], [place], [place]   ABO/Rh   Collection Time: 12/10/19  1:15 AM  Result Value Ref Range   ABO/RH(D)      O POS Performed at [facility], [address], [place], [place]   CBC   Collection Time: 12/10/19  1:21 AM  Result Value Ref Range   WBC 13.5 (H) 4.0 - 10.5 K/uL   RBC 3.37 (L) 3.87 - 5.11 MIL/uL   Hemoglobin  10.1 (L) 12.0 - 15.0 g/dL   HCT 12/12/19 (L) 90.2 - 40.9 %   MCV 95.0 80.0 - 100.0 fL   MCH 30.0 26.0 - 34.0 pg   MCHC 31.6 30.0 - 36.0 g/dL   RDW 73.5 32.9 - 92.4 %   Platelets 364 150 - 400 K/uL   nRBC 0.0 0.0 - 0.2 %    Patient Active Problem List   Diagnosis Date Noted   Normal labor 12/10/2019   Anemia in pregnancy 10/12/2019   History of chlamydia 10/08/2019   History of gonorrhea 10/08/2019   Supervision of low-risk pregnancy 10/05/2019   Acid reflux 10/05/2019    Assessment: Brandelyn Henne is a 24 y.o. G3P2002 at [redacted]w[redacted]d here for SROM @ 2340 with clear fluid  1. Labor: Patient has not had any induction/augmentation in the past and is  not planning for epidural. Patient is agreeable with induction process given PROM and increased risk of infection.  Cervical change since checked in MAU. Will not use cytotec or pitocin as patient is having regular, painful contractions that started just after arriving to MAU and since she is progressing on her own.  2. FWB: Cat I 3. Pain: per patient request 4. GBS: negative  Contraception: ppIUD, consent signed    Plan: Admit to LD   Melene Plan, MD  12/10/2019, 2:37 AM  Attestation:  I confirm that I have verified the information documented in the resident's note and that I have also personally reperformed the physical exam and all medical decision making activities.  The patient was seen and examined by me also Agree with note NST reactive and reassuring UCs as listed Cervical exams as listed in note Aviva Signs, CNM

## 2019-12-10 NOTE — Discharge Summary (Signed)
  Postpartum Discharge Summary      Patient Name: Heather Rush DOB: 04/03/1996 MRN: [id number]  Date of admission: 12/10/2019 Delivering Provider: KIM, RACHEL E   Date of discharge: 12/11/2019  Admitting diagnosis: Normal labor [O80, Z37.9] Intrauterine pregnancy: [redacted]w[redacted]d     Secondary diagnosis:  Active Problems:   Normal labor  Additional problems: choroid plexus cyst     Discharge diagnosis: Term Pregnancy Delivered                                                                                                Post partum procedures:IUD inserted  Augmentation: none  Complications: None  Hospital course:  Onset of Labor With Vaginal Delivery     23 y.o. yo G3P2002 at [redacted]w[redacted]d was admitted in Active Labor on 12/10/2019. Patient had an uncomplicated labor course as follows:  Presented in labor with SROM and progressed quickly to SVD 2nd degree midline and left labial laceration Membrane Rupture Time/Date: 11:40 PM ,12/09/2019   Intrapartum Procedures: Episiotomy: None [1]                                         Lacerations:  2nd degree [3];Perineal [11];Labial [10]  Patient had a delivery of a Viable infant. 12/10/2019  Information for the patient's newborn:  Birt, Girl Fannie [[id number]]  Delivery Method: Vag-Spont     Pateint had an uncomplicated postpartum course.  She is ambulating, tolerating a regular diet, passing flatus, and urinating well. Patient is discharged home in stable condition on 12/11/19.  Delivery time: 4:44 AM    Magnesium Sulfate received: No BMZ received: No Rhophylac:N/A MMR:No Transfusion:No  Physical exam  Vitals:   12/10/19 0820 12/10/19 1300 12/10/19 1711 12/11/19 0528  BP: 117/72 116/80 114/68 101/68  Pulse: 78 82 74 74  Resp: 18 18 16 16  Temp: 98.2 F (36.8 C)  98.5 F (36.9 C) 98.2 F (36.8 C)  TempSrc: Oral  Oral Oral  SpO2: 99% 100% 99% 100%  Weight:      Height:       General: alert, cooperative and no distress Lochia:  appropriate Uterine Fundus: firm Incision: N/A DVT Evaluation: No evidence of DVT seen on physical exam. Negative Homan's sign. No cords or calf tenderness. Labs: Lab Results  Component Value Date   WBC 18.1 (H) 12/11/2019   HGB 9.1 (L) 12/11/2019   HCT 28.6 (L) 12/11/2019   MCV 95.7 12/11/2019   PLT 330 12/11/2019   No flowsheet data found.  Discharge instruction: per After Visit Summary and "Baby and Me Booklet".  After visit meds:  Allergies as of 12/11/2019   No Known Allergies     Medication List    TAKE these medications   ibuprofen 600 MG tablet Commonly known as: ADVIL Take 1 tablet (600 mg total) by mouth every 6 (six) hours.   PRENATAL VITAMINS PO Take 1 tablet by mouth daily.       Diet: routine diet  Activity: Advance as tolerated. Pelvic rest for   6 weeks.   Outpatient follow up:4 weeks Follow up Appt: Future Appointments  Date Time Provider Department Center  12/12/2019 12:25 PM [facility] None   Follow up Visit:   Please schedule this patient for Postpartum visit in: 4 weeks with the following provider: Any provider In-Person For C/S patients schedule nurse incision check in weeks 2 weeks: no Low risk pregnancy complicated by: None Delivery mode:  SVD Anticipated Birth Control:  Post Placental IUD done, will need string check PP Procedures needed: String check and trim  Schedule Integrated BH visit: no       Newborn Data: Live born female  Birth Weight:   APGAR: 9, 9  Newborn Delivery   Birth date/time: 12/10/2019 04:44:00 Delivery type: Vaginal, Spontaneous      Baby Feeding: Breast Disposition:home with mother   12/11/2019 Frances Cresenzo-Dishmon, CNM   

## 2019-12-10 NOTE — Lactation Note (Signed)
This note was copied from a baby's chart. Lactation Consultation Note: P3, Mother ,Father and infant sleeping soundly.  Will continue to follow up to see mother. Patient Name: Heather Rush UPBDH'D Date: 12/10/2019     Maternal Data    Feeding Feeding Type: Breast Fed  LATCH Score Latch: Grasps breast easily, tongue down, lips flanged, rhythmical sucking.  Audible Swallowing: A few with stimulation  Type of Nipple: Everted at rest and after stimulation  Comfort (Breast/Nipple): Soft / non-tender  Hold (Positioning): Assistance needed to correctly position infant at breast and maintain latch.  LATCH Score: 8  Interventions Interventions: Assisted with latch;Skin to skin;Hand express;Support pillows;Position options  Lactation Tools Discussed/Used     Consult Status      [name] 12/10/2019, 12:02 PM

## 2019-12-10 NOTE — Plan of Care (Signed)
L&D care plan complete 

## 2019-12-11 DIAGNOSIS — Z30430 Encounter for insertion of intrauterine contraceptive device: Secondary | ICD-10-CM

## 2019-12-11 LAB — CBC
HCT: 28.6 % — ABNORMAL LOW (ref 36.0–46.0)
Hemoglobin: 9.1 g/dL — ABNORMAL LOW (ref 12.0–15.0)
MCH: 30.4 pg (ref 26.0–34.0)
MCHC: 31.8 g/dL (ref 30.0–36.0)
MCV: 95.7 fL (ref 80.0–100.0)
Platelets: 330 10*3/uL (ref 150–400)
RBC: 2.99 MIL/uL — ABNORMAL LOW (ref 3.87–5.11)
RDW: 14 % (ref 11.5–15.5)
WBC: 18.1 10*3/uL — ABNORMAL HIGH (ref 4.0–10.5)
nRBC: 0 % (ref 0.0–0.2)

## 2019-12-11 MED ORDER — IBUPROFEN 600 MG PO TABS: 600.0000 mg | ORAL_TABLET | Freq: Four times a day (QID) | ORAL | 0 refills | Status: AC

## 2019-12-11 NOTE — Discharge Instructions (Signed)
Intrauterine Device Insertion, Care After  This sheet gives you information about how to care for yourself after your procedure. Your health care provider may also give you more specific instructions. If you have problems or questions, contact your health care provider. What can I expect after the procedure? After the procedure, it is common to have:  Cramps and pain in the abdomen.  Light bleeding (spotting) or heavier bleeding that is like your menstrual period. This may last for up to a few days.  Lower back pain.  Dizziness.  Headaches.  Nausea. Follow these instructions at home:  Before resuming sexual activity, check to make sure that you can feel the IUD string(s). You should be able to feel the end of the string(s) below the opening of your cervix. If your IUD string is in place, you may resume sexual activity. ? If you had a hormonal IUD inserted more than 7 days after your most recent period started, you will need to use a backup method of birth control for 7 days after IUD insertion. Ask your health care provider whether this applies to you.  Continue to check that the IUD is still in place by feeling for the string(s) after every menstrual period, or once a month.  Take over-the-counter and prescription medicines only as told by your health care provider.  Do not drive or use heavy machinery while taking prescription pain medicine.  Keep all follow-up visits as told by your health care provider. This is important. Contact a health care provider if:  You have bleeding that is heavier or lasts longer than a normal menstrual cycle.  You have a fever.  You have cramps or abdominal pain that get worse or do not get better with medicine.  You develop abdominal pain that is new or is not in the same area of earlier cramping and pain.  You feel lightheaded or weak.  You have abnormal or bad-smelling discharge from your vagina.  You have pain during sexual  activity.  You have any of the following problems with your IUD string(s): ? The string bothers or hurts you or your sexual partner. ? You cannot feel the string. ? The string has gotten longer.  You can feel the IUD in your vagina.  You think you may be pregnant, or you miss your menstrual period.  You think you may have an STI (sexually transmitted infection). Get help right away if:  You have flu-like symptoms.  You have a fever and chills.  You can feel that your IUD has slipped out of place. Summary  After the procedure, it is common to have cramps and pain in the abdomen. It is also common to have light bleeding (spotting) or heavier bleeding that is like your menstrual period.  Continue to check that the IUD is still in place by feeling for the string(s) after every menstrual period, or once a month.  Keep all follow-up visits as told by your health care provider. This is important.  Contact your health care provider if you have problems with your IUD string(s), such as the string getting longer or bothering you or your sexual partner. This information is not intended to replace advice given to you by your health care provider. Make sure you discuss any questions you have with your health care provider. Document Revised: 10/25/2017 Document Reviewed: 10/03/2016 Elsevier Patient Education  2020 Else[employer] Inc.  

## 2019-12-11 NOTE — Progress Notes (Addendum)
Post Partum Day 1 Subjective: no complaints, up ad lib, voiding, tolerating PO and feels good.  Objective: Blood pressure 101/68, pulse 74, temperature 98.2 F (36.8 C), temperature source Oral, resp. rate 16, height 5\' 2"  (1.575 m), weight 75.9 kg, last menstrual period 03/03/2019, SpO2 100 %, unknown if currently breastfeeding.  Physical Exam:  General: alert, cooperative and no distress Lochia: appropriate Uterine Fundus: Difficult to palpate DVT Evaluation: No evidence of DVT seen on physical exam.  Recent Labs    12/10/19 0121 12/11/19 0525  HGB 10.1* 9.1*  HCT 32.0* 28.6*    Assessment/Plan: Plan for discharge tomorrow   Jay Kempe is a 24 y.o. F G3P3003 PPD #1 from VD @ 0444 at [redacted]w[redacted]d. She is feeling well, ambulating, tolerating PO intake. No complaints or concerns at this time.   LOS: 1 day   [redacted]w[redacted]d Loeliger 12/11/2019, 7:25 AM   Pt desires DC, see DC summary. 12/13/2019

## 2019-12-11 NOTE — Lactation Note (Addendum)
This note was copied from a baby's chart. Lactation Consultation Note  Patient Name: Heather Rush ZOXWR'U Date: 12/11/2019 Reason for consult: Follow-up assessment;Term  P3 mother whose infant is now 57 hours old.  Mother breast fed her first two children for only 2-3 weeks each.  Mother had baby latched in the cross cradle hold on the right  breast when I arrived.  Baby was sucking on the nipple tip only.  While baby was sucking, father was hand expressing colostrum from the left breast.  Offered to assist mother with obtaining a better latch and she accepted.  Suggested mother always feed STS until baby is feeding well. ( She had a large onesie on her).  Taught the cradle hold and discussed how to obtain a deep latch.  Baby does an exceptional job of opening her mouth wide and is patient for latching.  Demonstrated how to place baby deeply into breast tissue and use gentle stimulation to be sure she continues actively sucking.  Parents informed me that she has been feeding for approximately one hour prior to my arrival.  Discussed non nutritive sucking.  Baby is tired at this time.  Asked mother to break the suction and demonstrate latching for me.  I asked her to do this since she will be discharged and I need to feel comfortable with her ability to latch deeply.  Mother demonstrated and did a much better job this time.  Even though she stated the previous latch was not painful, she did state that this latch felt "much better."  She was able to observe a deep latch with active sucking.  Mother appreciative.  Engorgement prevention/treatment discussed.  Mother has a manual pump and a DEBP for home use.  She has our phone number to call for questions/concerns after discharge.  Father supportive.   Maternal Data Formula Feeding for Exclusion: No Has patient been taught Hand Expression?: Yes Does the patient have breastfeeding experience prior to this delivery?: Yes  Feeding Feeding Type:  Breast Fed  LATCH Score Latch: Grasps breast easily, tongue down, lips flanged, rhythmical sucking.  Audible Swallowing: None  Type of Nipple: Everted at rest and after stimulation  Comfort (Breast/Nipple): Soft / non-tender  Hold (Positioning): Assistance needed to correctly position infant at breast and maintain latch.  LATCH Score: 7  Interventions Interventions: Breast feeding basics reviewed;Assisted with latch;Skin to skin;Breast massage;Hand express;Breast compression;Adjust position;Position options;Support pillows  Lactation Tools Discussed/Used     Consult Status Consult Status: Complete Date: 12/11/19 Follow-up type: Call as needed    [name] 12/11/2019, 8:52 AM

## 2019-12-12 ENCOUNTER — Other Ambulatory Visit (HOSPITAL_COMMUNITY): Admission: RE | Admit: 2019-12-12 | Payer: 59 | Source: Ambulatory Visit

## 2019-12-15 ENCOUNTER — Inpatient Hospital Stay (HOSPITAL_COMMUNITY): Admission: AD | Admit: 2019-12-15 | Payer: 59 | Source: Home / Self Care | Admitting: Obstetrics and Gynecology

## 2019-12-15 ENCOUNTER — Inpatient Hospital Stay (HOSPITAL_COMMUNITY): Payer: 59

## 2019-12-28 ENCOUNTER — Other Ambulatory Visit: Payer: Self-pay

## 2019-12-28 ENCOUNTER — Encounter: Payer: Self-pay | Admitting: Advanced Practice Midwife

## 2019-12-28 DIAGNOSIS — B379 Candidiasis, unspecified: Secondary | ICD-10-CM

## 2019-12-28 MED ORDER — FLUCONAZOLE 200 MG PO TABS: 200.0000 mg | ORAL_TABLET | Freq: Once | ORAL | 0 refills | Status: AC

## 2019-12-28 MED ORDER — FLUCONAZOLE 100 MG PO TABS: 100.0000 mg | ORAL_TABLET | Freq: Every day | ORAL | 0 refills | Status: AC

## 2020-01-04 ENCOUNTER — Encounter: Payer: Self-pay | Admitting: Advanced Practice Midwife

## 2020-01-06 ENCOUNTER — Ambulatory Visit: Payer: Self-pay

## 2020-01-06 NOTE — Lactation Note (Signed)
This note was copied from a baby's chart. Lactation Consultation Note  Patient Name: Heather Rush HFWYO'V Date: 01/06/2020     01/06/2020  Name: Heather Rush MRN: [id number] Date of Birth: 12/10/2019 Gestational Age: Gestational Age: [redacted]w[redacted]d Birth Weight: 118.3 oz Weight today:    8 pounds 15.3 ounces (4062 grams) with clean newborn diaper  52 week old term infant presents today with mom and dad for feeding assessment.   Infant has gained 867 grams in the last 26 days with an average daily weight gain of 33 grams a day.   Mom reports infant is fussy at night from 12-2 and will not feed well. Reviewed milk supply ebb and flow and cluster feeding.   Infant with thick labial frenulum that inserts at the bottom of the gum ridge. Upper lip with some tightness with flanging. Infant with sucking blister to upper center lip. Infant with cobble stone lips. Infant with thin short posterior lingual frenulum noted. Infant with good tongue extension and lateralization. Infant with hump to back of tongue when suckling. Infant with clicking throughout feedings on the breast and bottle. Infant tends to tongue thrust on the bottle per parents. Mom's nipple slightly compressed post feeding, she has no pain with feeding. Parents given information on tongue and lip restrictions and how they can effect milk supply and milk removal. Parents to research and decide if they want to have infant evaluated.   Infant is getting an occasional bottle when mom is away. Mom reports they tried the Nanobebe bottle and infant is tongue thrusting on it. Mom to try Dr. Saul Fordyce bottle. Reviewed paced bottle feeding with parents and encouraged with each feeding.   Mom is using the Haakaa some, she is discarding milk. Enc mom to save milk for when dad gives bottles.    Mom being treated with Diflucan and infant being Nystatin for Thrush. Mom with no symptoms, infant with some white to tongue. Reviewed thrush care for  infant and mom and handout given. Enc mom to continue treatment for 1-2 weeks after symptoms gone.   Infant to follow up with [facility] tomorrow. Infant to follow up with Lactation in 2 weeks.     General Information: Mother's reason for visit: Feeding assessment, concerns with milk supply Consult: Initial Lactation consultant: Nonah Mattes RN,IBCLC Breastfeeding experience: Feeding well   Maternal medications: Pre-natal vitamin(Diflucan x 2 weeks)  Breastfeeding History: Frequency of breast feeding: every 2-3 hours Duration of feeding: 20 minutes, usually on one breast per feeding  Supplementation: Supplement method: bottle(Nano Bebe)         Breast milk volume: 2-3 ounces Breast milk frequency: every few days   Pump type: Other(Luna Motif) Pump frequency: Haakaa with a few feedings a day Pump volume: 1 ounce  Infant Output Assessment: Voids per 24 hours: 8+ Urine color: Clear yellow Stools per 24 hours: 8+ Stool color: Yellow  Breast Assessment: Breast: Soft, Compressible Nipple: Erect Pain level: 0 Pain interventions: Bra, Other, Lanolin(Lanolin, asked mom to stop using since she has yeast)  Feeding Assessment: Infant oral assessment: Variance Infant oral assessment comment: see note Positioning: Cross cradle(right breast, 20 minutes) Latch: 2 - Grasps breast easily, tongue down, lips flanged, rhythmical sucking. Audible swallowing: 2 - Spontaneous and intermittent Type of nipple: 2 - Everted at rest and after stimulation Comfort: 2 - Soft/non-tender Hold: 2 - No assistance needed to correctly position infant at breast LATCH score: 10 Latch assessment: Deep Lips flanged: Yes Suck assessment: Displays both  Pre-feed weight: 4062 grams Post feed weight: 4174 grams Amount transferred: 112 ml Amount supplemented: 0  Additional Feeding Assessment:                                    Totals: Total amount transferred: 112 ml Total  supplement given: 0 Total amount pumped post feed: did not pump   Plan:  1. Offer infant the breast with feeding cues 2. Keep infant awake with feeding as needed  3. Feed infant skin to skin 4. Massage/compress breast with feeding as needed to keep infant active at the breast 5. Empty the first breast before offering the second 6. When offering the bottle use the paced bottle feeding (video on [email or web address]) 7. Try the Dr. Theora Gianotti Level 1 bottle and if choking or drooling, change to the preemie nipple 8. Infant needs about 75-100 ml (2.5-3.5 ounces) for 8 feedings a day or 600-800 ml (20-26 ounces) in 24 hours. Infant may take more or less depending on how often she feeds. Feed infant until she is satisfied.  9. Would recommend that you pump about 2-3 times a day after breast feeding to protect milk supply can save for later if not needed now.  10. Keep up the good work 11. Thank you for allowing me to assist you today 12. Please call with any questions/concerns as needed [phone] 13. Follow up with Lactation in 2 weeks  Ed Blalock RN, IBCLC                                                   Ed Blalock 01/06/2020, 8:17 AM

## 2020-01-18 ENCOUNTER — Other Ambulatory Visit: Payer: Self-pay

## 2020-01-18 ENCOUNTER — Ambulatory Visit (INDEPENDENT_AMBULATORY_CARE_PROVIDER_SITE_OTHER): Payer: 59 | Admitting: Advanced Practice Midwife

## 2020-01-18 ENCOUNTER — Encounter: Payer: Self-pay | Admitting: Advanced Practice Midwife

## 2020-01-18 DIAGNOSIS — Z1332 Encounter for screening for maternal depression: Secondary | ICD-10-CM | POA: Diagnosis not present

## 2020-01-18 DIAGNOSIS — Z30431 Encounter for routine checking of intrauterine contraceptive device: Secondary | ICD-10-CM

## 2020-01-18 MED ORDER — HYDROCORTISONE ACETATE 25 MG RE SUPP: 25.0000 mg | Freq: Two times a day (BID) | RECTAL | 2 refills | Status: AC

## 2020-01-18 NOTE — Progress Notes (Signed)
Appointment Date: 01/18/2020  OBGYN Clinic: [facility]   Chief Complaint:  Postpartum Visit  History of Present Illness: Heather Rush is a 24 y.o. African-American [phone] (Patient's last menstrual period was 03/03/2019.), seen for the above chief complaint. Her past medical history is significant for none   She is s/p normal spontaneous vaginal delivery on 12/10/2019 at 40.2 weeks; she was discharged to home on PPD#1. Pregnancy complicated by none. Baby is doing well.  Patient had IUD placed immediate postpartum. She can feel the string at the introitus. She also has a hemorrhoid and would like medication for this.   Complains of none  Vaginal bleeding or discharge: spotting  Mode of feeding infant: Breast Intercourse: No  Contraception: IUD PP depression s/s: No .  Any bowel or bladder issues: No  Pap smear: no abnormalities (date: 2019)  Review of Systems: Her 12 point review of systems is negative or as noted in the History of Present Illness.  Patient Active Problem List   Diagnosis Date Noted  . Normal labor 12/10/2019  . Anemia in pregnancy 10/12/2019  . History of chlamydia 10/08/2019  . History of gonorrhea 10/08/2019  . Supervision of low-risk pregnancy 10/05/2019  . Acid reflux 10/05/2019    Medications Heather Lobue "Zigmund Daniel" had no medications administered during this visit. Current Outpatient Medications  Medication Sig Dispense Refill  . ibuprofen (ADVIL) 600 MG tablet Take 1 tablet (600 mg total) by mouth every 6 (six) hours. 30 tablet 0  . Prenatal Vit-Fe Fumarate-FA (PRENATAL VITAMINS PO) Take 1 tablet by mouth daily.    . fluconazole (DIFLUCAN) 100 MG tablet Take 1 tablet (100 mg total) by mouth daily. Day 1: take 200 mg tablet. For the following 13 days: take 100 mg tablet daily. (Patient not taking: Reported on 01/18/2020) 13 tablet 0   No current facility-administered medications for this visit.    Allergies Patient has no known allergies.  Physical  Exam:  Physical Exam  Constitutional: She is oriented to person, place, and time and well-developed, well-nourished, and in no distress. No distress.  HENT:  Head: Normocephalic.  Cardiovascular: Normal rate.  Pulmonary/Chest: Effort normal.  Abdominal: Soft. There is no abdominal tenderness. There is no rebound.  Genitourinary:    Genitourinary Comments:  External: no lesion Vagina: small amount of white discharge Cervix: pink, smooth, no CMT, IUD tail seen at the os. Trimmed to about 3 cm  Uterus: NSSC Adnexa: NT    Neurological: She is alert and oriented to person, place, and time.  Skin: Skin is warm and dry.  Psychiatric: Affect normal.  Nursing note and vitals reviewed.    PP Depression Screening:   Edinburgh Postnatal Depression Scale - 01/18/20 1032      Edinburgh Postnatal Depression Scale:  In the Past 7 Days   I have been able to laugh and see the funny side of things.  0    I have looked forward with enjoyment to things.  0    I have blamed myself unnecessarily when things went wrong.  0    I have been anxious or worried for no good reason.  3    I have felt scared or panicky for no good reason.  0    Things have been getting on top of me.  2    I have been so unhappy that I have had difficulty sleeping.  0    I have felt sad or miserable.  0    I have been so  unhappy that I have been crying.  0    The thought of harming myself has occurred to me.  0    Edinburgh Postnatal Depression Scale Total  5       Assessment:Patient is a 24 y.o. [id number] who is 6 weeks postpartum from a normal spontaneous vaginal delivery.  She is doing well.   Plan: 1. Postpartum care and examination   2. IUD check up      RTC 1 year    Thressa Sheller DNP, CNM  01/18/20  10:38 AM  [facility] for [employer], [facility]

## 2020-01-20 ENCOUNTER — Ambulatory Visit: Payer: Self-pay

## 2020-01-20 NOTE — Lactation Note (Addendum)
This note was copied from a baby's chart. Lactation Consultation Note  Patient Name: Heather Rush [id number] Date: 01/20/2020     01/20/2020  Name: Heather Rush MRN: [id number] Date of Birth: 12/10/2019 Gestational Age: Gestational Age: [redacted]w[redacted]d Birth Weight: 118.3 oz Weight today:  Weight: 9 lb 15.9 oz (5670 g)   39 week old term infant presents today with mom for feeding assessment.   Infant has gained 470 grams in the last 14 days with an average daily weight gain of 34 grams a day.   Infant is spending long periods at night with no sleeping. Dad works nights.   Infant with thick labial frenulum that inserts at the bottom of the gum ridge. Upper lip with some tightness with flanging. Infant with sucking blister to upper center lip. Cobble Stone lips have improved. Infant with thin short posterior lingual frenulum noted. Infant with good tongue extension and lateralization. Infant with hump to back of tongue when suckling. Infant with clicking throughout feedings on the breast and bottle. Infant tends to tongue thrust on the bottle per parents. Mom's nipple slightly compressed post feeding, she has no pain with feeding. Infant will not take a pacifier. Mom reports she read some on the tongue and lip restrictions. She reports that she spoke with her Pediatrician and they would like to wait and see how she does.  Reviewed with mom that I would recommend that she continue to pump a few times a day to protect milk supply until we are sure she is gaining well over time.   Mom is planning to return to work in about 8 weeks. She will be working from home. Reviewed pumping for return to work about 2 weeks before returning. Reviewed pumping anytime infant gets a bottle when mom away from infant.   Infant to follow up with [facility] mid March. Infant to follow up with Lactation as needed.    General Information: Mother's reason for visit: Follow up feeding assessment Consult:  Follow-up Lactation consultant: Ivin Booty Liahm Grivas RN,IBCLC Breastfeeding experience: BF well per mom   Maternal medications: Pre-natal vitamin  Breastfeeding History: Frequency of breast feeding: every 2-3 hours, some longer periods at night Duration of feeding: 20 minutes, one breast per feeding  Supplementation: Supplement method: bottle(Dr. Brown's)         Breast milk volume: 3 ounces Breast milk frequency: Occasional Bottles 1 in the last few weeks   Pump type: Other(Luna Motif)      Infant Output Assessment: Voids per 24 hours: 10   Stools per 24 hours: 8+ Stool color: Yellow  Breast Assessment: Breast: Soft, Compressible Nipple: Erect Pain level: 0 Pain interventions: Bra  Feeding Assessment: Infant oral assessment: Variance Infant oral assessment comment: see note Positioning: Cross cradle Latch: 2 - Grasps breast easily, tongue down, lips flanged, rhythmical sucking. Audible swallowing: 2 - Spontaneous and intermittent Type of nipple: 2 - Everted at rest and after stimulation Comfort: 2 - Soft/non-tender Hold: 2 - No assistance needed to correctly position infant at breast LATCH score: 10 Latch assessment: Deep Lips flanged: Yes Suck assessment: Displays both   Pre-feed weight: 4532 grams Post feed weight: 4588 grams Amount transferred: 56 ml Amount supplemented: 0  Additional Feeding Assessment:                                    Totals: Total amount transferred: 56 ml Total supplement given: 0  Total amount pumped post feed: did not pump   Plan: 1. Offer infant the breast with feeding cues 2. Keep infant awake with feeding as needed  3. Feed infant skin to skin 4. Massage/compress breast with feeding as needed to keep infant active at the breast 5. Empty the first breast before offering the second 6. When offering the bottle use the paced bottle feeding (video on [email or web address].[email or web address]) 7. Try the Dr. Theora Gianotti Level 1 bottle and if choking  or drooling, change to the preemie nipple 8. Infant needs about 84-113 ml (3-4 ounces) for 8 feedings a day or 675-900 ml (23-30 ounces) in 24 hours. Infant may take more or less depending on how often she feeds. Feed infant until she is satisfied.  9. Would recommend that you pump about 2-3 times a day after breast feeding to protect milk supply can save for later if not needed now.  10. Keep up the good work 11. Thank you for allowing me to assist you today 12. Please call with any questions/concerns as needed [phone] 13. Follow up with Lactation as needed  Ed Blalock RN, IBCLC                                                    Ed Blalock 01/20/2020, 8:17 AM

## 2020-02-14 ENCOUNTER — Encounter: Payer: Self-pay | Admitting: Advanced Practice Midwife

## 2020-03-05 ENCOUNTER — Encounter: Payer: Self-pay | Admitting: Advanced Practice Midwife

## 2020-03-22 ENCOUNTER — Encounter: Payer: Self-pay | Admitting: Advanced Practice Midwife

## 2020-06-16 ENCOUNTER — Encounter: Payer: Self-pay | Admitting: Advanced Practice Midwife

## 2021-11-07 IMAGING — US US MFM OB DETAIL+14 WK
1 series · 13 of 28 positions shown · non-contrast
Comparison: none

[Series 1: us mfm ob detail+14 wk · 111 acquisitions, 13 frames shown]
[im 5/111]
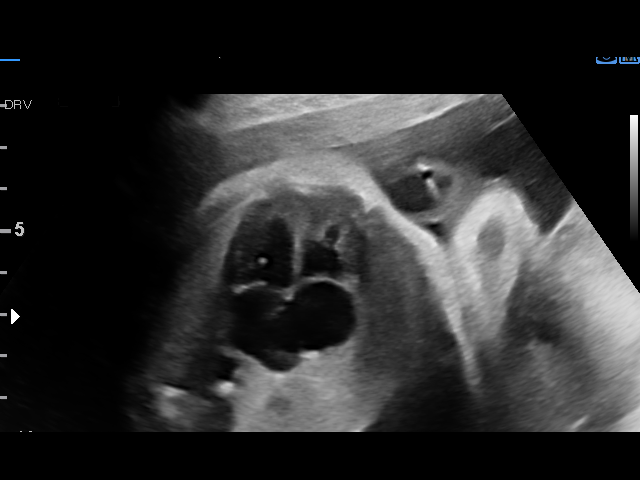
[im 13/111]
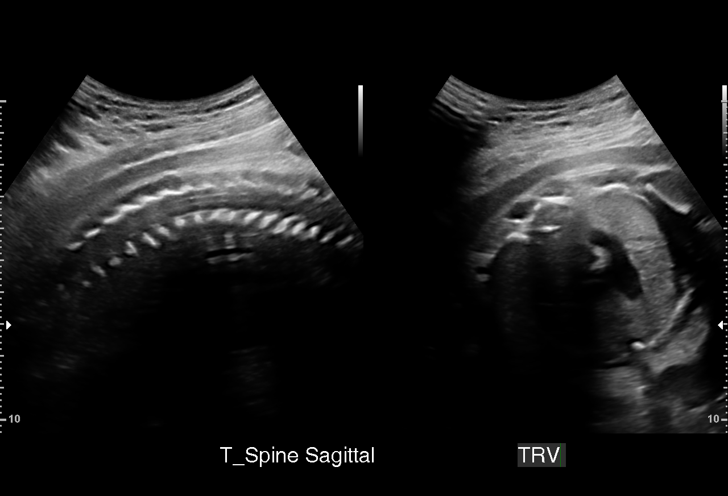
[im 21/111]
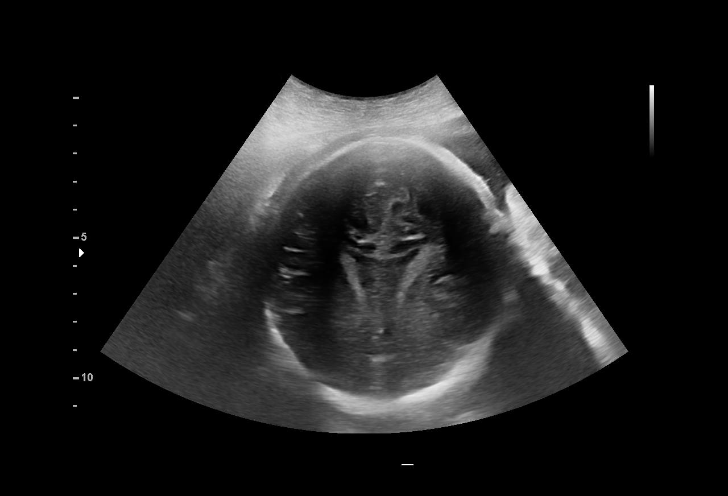
[im 29/111]
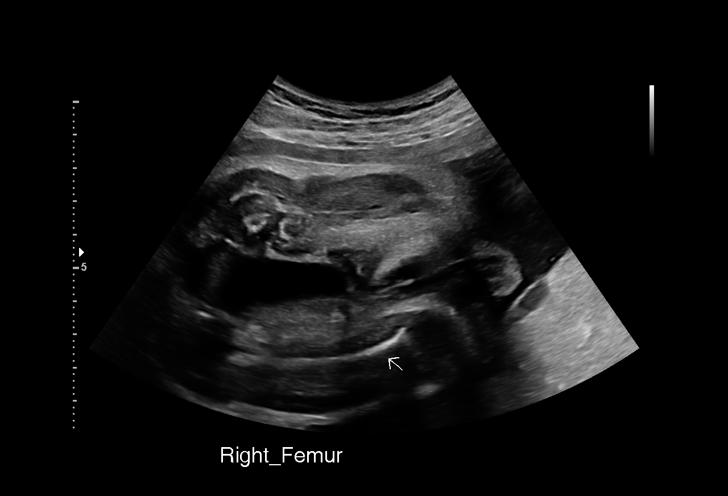
[im 37/111]
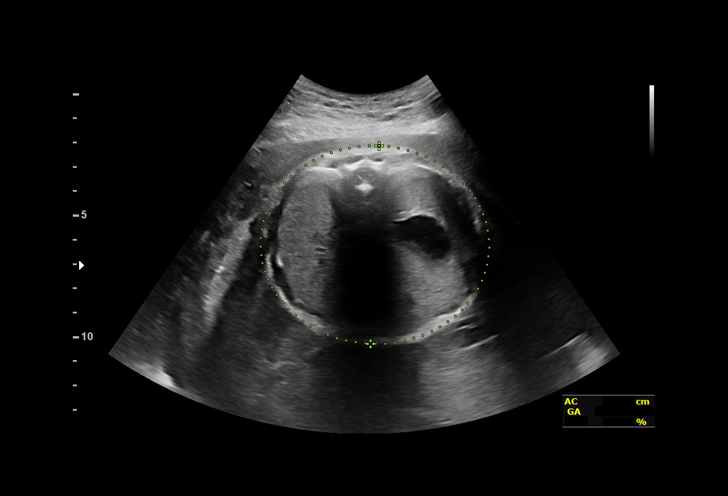
[im 45/111]
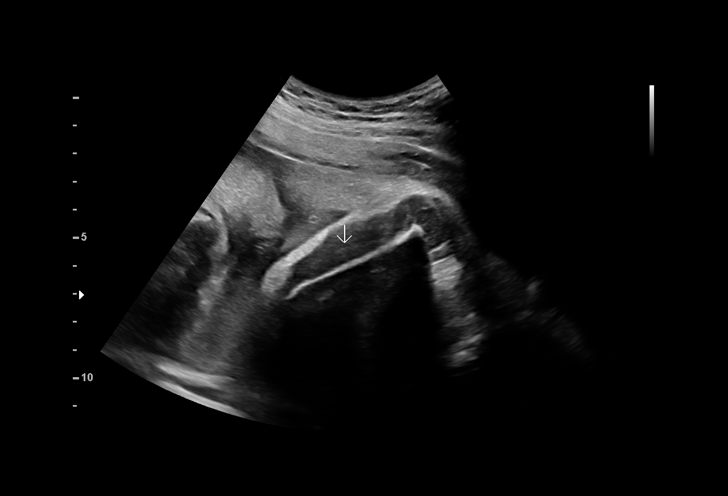
[im 58/111]
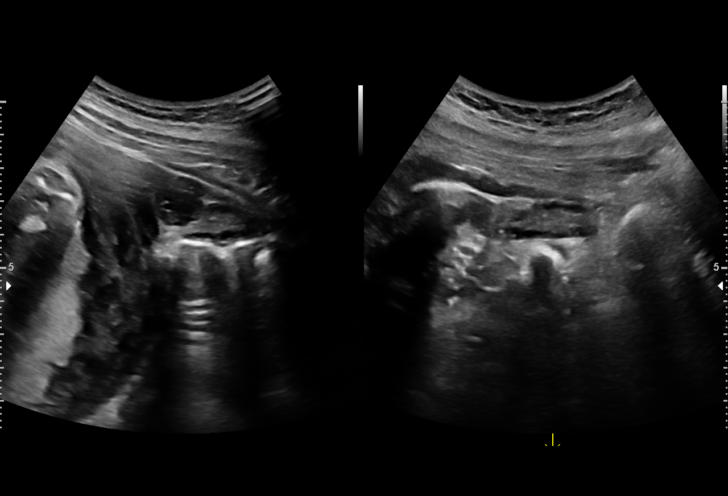
[im 66/111]
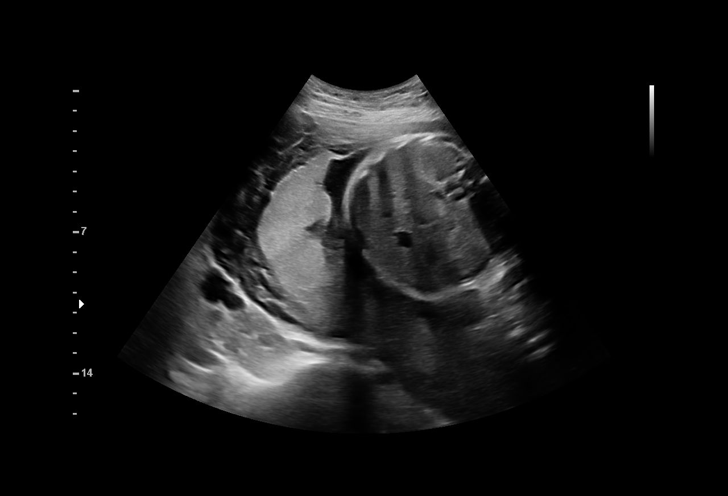
[im 74/111]
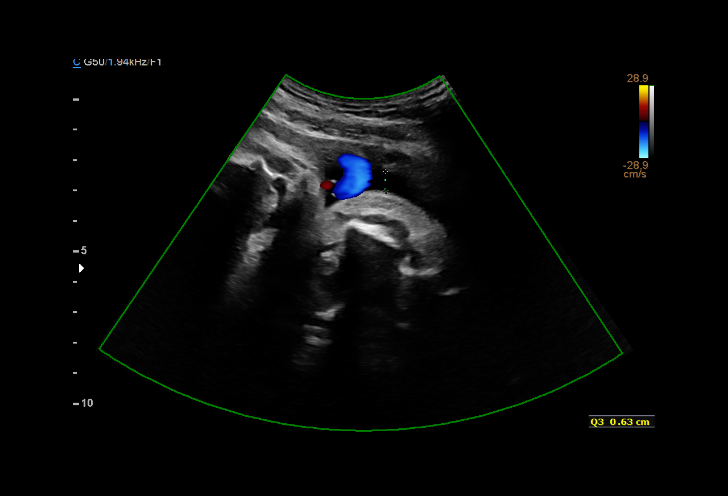
[im 82/111]
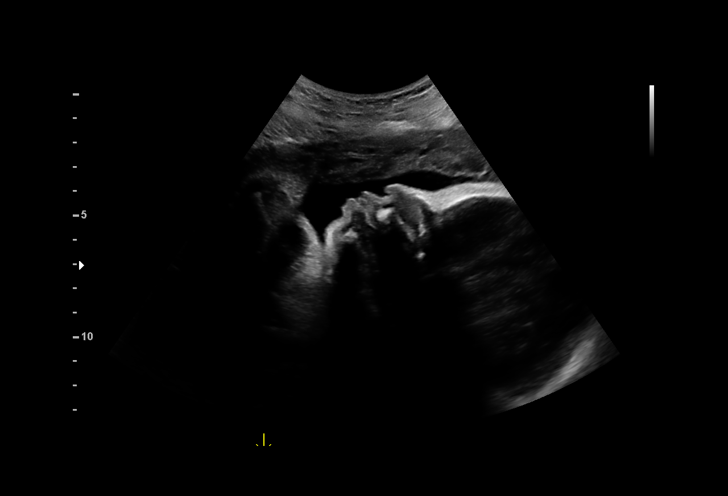
[im 90/111]
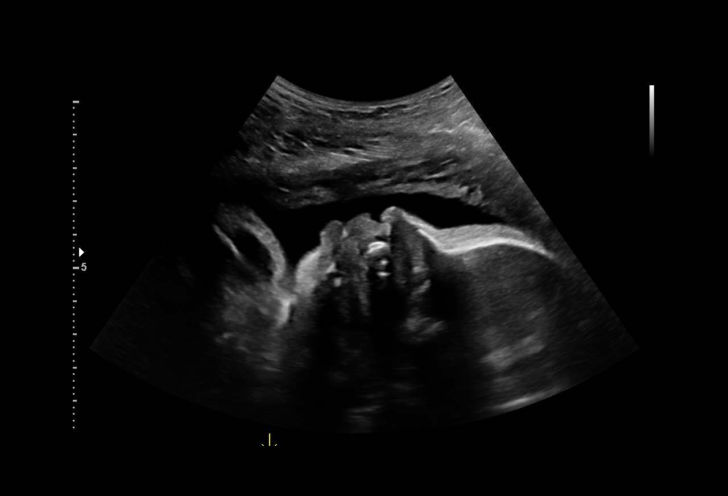
[im 98/111]
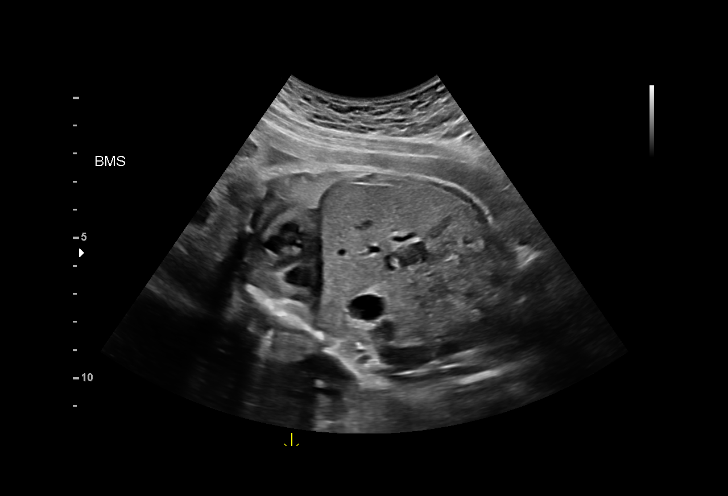
[im 106/111]
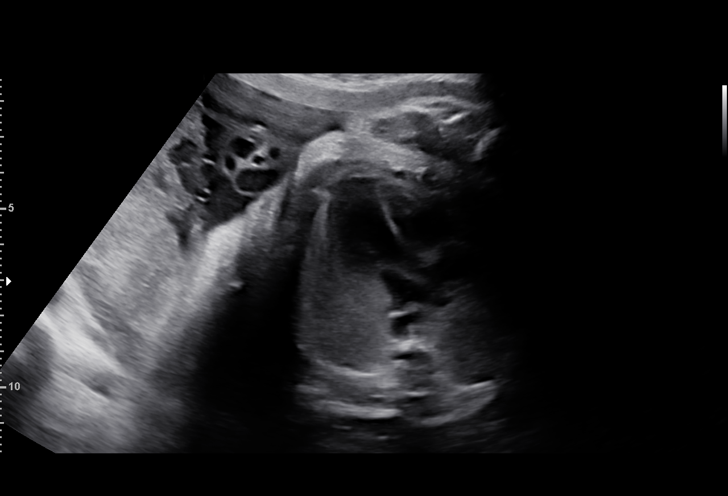

[13 of 28 positions shown; findings below may reference images not displayed]

----------------------------------------------------------------------

 ----------------------------------------------------------------------
Indications

  Fetal abnormality - other known or
  suspected (CPC seen on [place]; resolved, not seen on today's US)
  Encounter for antenatal screening for
  malformations
  32 weeks gestation of pregnancy
 ----------------------------------------------------------------------
Fetal Evaluation

 Num Of Fetuses:         1
 Fetal Heart Rate(bpm):  145
 Cardiac Activity:       Observed
 Presentation:           Cephalic
 Placenta:               Posterior
 P. Cord Insertion:      Visualized

 Amniotic Fluid
 AFI FV:      Within normal limits

 AFI Sum(cm)     %Tile       Largest Pocket(cm)
 11.96           32

 RUQ(cm)       RLQ(cm)       LUQ(cm)        LLQ(cm)

Biometry

 BPD:      86.1  mm     G. Age:  34w 5d         94  %    CI:        78.86   %    70 - 86
                                                         FL/HC:      19.3   %    19.1 -
 HC:      306.6  mm     G. Age:  34w 1d         60  %    HC/AC:      1.11        0.96 -
 AC:      276.6  mm     G. Age:  31w 5d         29  %    FL/BPD:     68.6   %    71 - 87
 FL:       59.1  mm     G. Age:  30w 6d          7  %    FL/AC:      21.4   %    20 - 24
 HUM:      54.2  mm     G. Age:  31w 4d         36  %
 LV:        2.7  mm

 Est. FW:    7191  gm      4 lb 2 oz     25  %
OB History

 Gravidity:    3         Term:   2
 Living:       2
Gestational Age

 LMP:           32w 3d        Date:  03/03/19                 EDD:   12/08/19
 U/S Today:     32w 6d                                        EDD:   12/05/19
 Best:          32w 3d     Det. By:  LMP  (03/03/19)          EDD:   12/08/19
Anatomy

 Cranium:               Appears normal         LVOT:                   Appears normal
 Cavum:                 Not well visualized    Aortic Arch:            Appears normal
 Ventricles:            Appears normal         Ductal Arch:            Not well visualized
 Choroid Plexus:        Appears normal         Diaphragm:              Appears normal
 Cerebellum:            Appears normal         Stomach:                Appears normal, left
                                                                       sided
 Posterior Fossa:       Appears normal         Abdomen:                Appears normal
 Nuchal Fold:           Not applicable (>20    Abdominal Wall:         Not well visualized
                        wks GA)
 Face:                  Appears normal         Cord Vessels:           Appears normal (3
                        (orbits and profile)                           vessel cord)
 Lips:                  Appears normal         Kidneys:                Appear normal
 Palate:                Not well visualized    Bladder:                Appears normal
 Thoracic:              Appears normal         Spine:                  Ltd views no
                                                                       intracranial signs of
                                                                       NTD
 Heart:                 Appears normal         Upper Extremities:      Appears normal
                        (4CH, axis, and
                        situs)
 RVOT:                  Not well visualized    Lower Extremities:      Appears normal

 Other:  Heels visualized. Technically difficult due to advanced GA and fetal
         position.
Cervix Uterus Adnexa

 Cervix
 Not visualized (advanced GA >70wks)

 Uterus
 No abnormality visualized.

 Left Ovary
 Size(cm)       3.2  x   1.3    x  2.5       Vol(ml):
 Within normal limits.

 Right Ovary
 Size(cm)       2.1  x   1.3    x  1.8       Vol(ml):
 Within normal limits.
Comments

 This patient was seen for a detailed fetal anatomy scan as
 she recently transferred her care for delivery at [HOSPITAL]
 from hospital in [REDACTED] [place].  The patient reports 2
 prior uncomplicated full-term deliveries. She denies any
 significant past medical history and denies any problems in
 her current pregnancy.  Choroid plexus cysts were noted on
 her fetal anatomy scan performed in [place].
 She was informed that the fetal growth and amniotic fluid
 level were appropriate for her gestational age.
 The previously noted choroid plexus cysts were not visualized
 today.  The views of the fetal anatomy were limited today due
 to her advanced gestational age.
 The patient was informed that anomalies may be missed due
 to technical limitations. If the fetus is in a suboptimal position
 or maternal habitus is increased, visualization of the fetus in
 the maternal uterus may be impaired.
 Follow up as indicated.
